# Patient Record
Sex: Female | Born: 2000
Health system: Southern US, Community
[De-identification: ages and names within clinical notes are randomized; demographics above are authoritative.]

---
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
## 2021-03-08 DIAGNOSIS — Z20822 Contact with and (suspected) exposure to covid-19: Secondary | ICD-10-CM | POA: Diagnosis not present

## 2021-03-11 ENCOUNTER — Other Ambulatory Visit (HOSPITAL_BASED_OUTPATIENT_CLINIC_OR_DEPARTMENT_OTHER): Payer: Self-pay

## 2021-03-11 MED ORDER — CARESTART COVID-19 HOME TEST VI KIT
PACK | 0 refills | Status: DC
  Filled 2021-03-11: qty 2, 4d supply, fill #0

## 2021-08-16 ENCOUNTER — Ambulatory Visit: Payer: 59 | Admitting: Family Medicine

## 2021-08-16 ENCOUNTER — Encounter: Payer: Self-pay | Admitting: Family Medicine

## 2021-08-16 VITALS — BP 117/53 | HR 78 | Temp 98.3°F | Resp 12 | Ht 67.0 in | Wt 184.2 lb

## 2021-08-16 DIAGNOSIS — Z0000 Encounter for general adult medical examination without abnormal findings: Secondary | ICD-10-CM

## 2021-08-16 DIAGNOSIS — N926 Irregular menstruation, unspecified: Secondary | ICD-10-CM

## 2021-08-16 DIAGNOSIS — Z7689 Persons encountering health services in other specified circumstances: Secondary | ICD-10-CM | POA: Diagnosis not present

## 2021-08-16 DIAGNOSIS — Z23 Encounter for immunization: Secondary | ICD-10-CM | POA: Diagnosis not present

## 2021-08-16 LAB — LIPID PANEL
Cholesterol: 181 mg/dL (ref 0–200)
HDL: 37.9 mg/dL — ABNORMAL LOW (ref >39.00)
LDL Cholesterol: 123 mg/dL — ABNORMAL HIGH (ref 0–99)
NonHDL: 142.82
Total CHOL/HDL Ratio: 5
Triglycerides: 98 mg/dL (ref 0.0–149.0)
VLDL: 19.6 mg/dL (ref 0.0–40.0)

## 2021-08-16 LAB — CBC WITH DIFFERENTIAL/PLATELET
Basophils Absolute: 0 10*3/uL (ref 0.0–0.1)
Basophils Relative: 0.3 % (ref 0.0–3.0)
Eosinophils Absolute: 0.1 10*3/uL (ref 0.0–0.7)
Eosinophils Relative: 1 % (ref 0.0–5.0)
HCT: 40.1 % (ref 36.0–49.0)
Hemoglobin: 13.1 g/dL (ref 12.0–16.0)
Lymphocytes Relative: 26.6 % (ref 24.0–48.0)
Lymphs Abs: 2.2 10*3/uL (ref 0.7–4.0)
MCHC: 32.6 g/dL (ref 31.0–37.0)
MCV: 84.5 fl (ref 78.0–98.0)
Monocytes Absolute: 0.6 10*3/uL (ref 0.1–1.0)
Monocytes Relative: 6.8 % (ref 3.0–12.0)
Neutro Abs: 5.4 10*3/uL (ref 1.4–7.7)
Neutrophils Relative %: 65.3 % (ref 43.0–71.0)
Platelets: 283 10*3/uL (ref 150.0–575.0)
RBC: 4.75 Mil/uL (ref 3.80–5.70)
RDW: 12.9 % (ref 11.4–15.5)
WBC: 8.3 10*3/uL (ref 4.5–13.5)

## 2021-08-16 LAB — COMPREHENSIVE METABOLIC PANEL
ALT: 11 U/L (ref 0–35)
AST: 16 U/L (ref 0–37)
Albumin: 4.6 g/dL (ref 3.5–5.2)
Alkaline Phosphatase: 80 U/L (ref 47–119)
BUN: 15 mg/dL (ref 6–23)
CO2: 28 mEq/L (ref 19–32)
Calcium: 9.7 mg/dL (ref 8.4–10.5)
Chloride: 102 mEq/L (ref 96–112)
Creatinine, Ser: 0.68 mg/dL (ref 0.40–1.20)
GFR: 125.97 mL/min (ref >60.00)
Glucose, Bld: 94 mg/dL (ref 70–99)
Potassium: 4.2 mEq/L (ref 3.5–5.1)
Sodium: 138 mEq/L (ref 135–145)
Total Bilirubin: 0.4 mg/dL (ref 0.2–1.2)
Total Protein: 8.1 g/dL (ref 6.0–8.3)

## 2021-08-16 LAB — TSH: TSH: 1.54 u[IU]/mL (ref 0.40–5.00)

## 2021-08-16 NOTE — Progress Notes (Signed)
BP (!) 117/53 (BP Location: Left Arm, Cuff Size: Normal)    Pulse 78    Temp 98.3 F (36.8 C) (Oral)    Resp 12    Ht 5\' 7"  (1.702 m)    Wt 184 lb 3.2 oz (83.6 kg)    LMP 06/02/2021    SpO2 100%    BMI 28.85 kg/m    Subjective:    Patient ID: Christina Coleman, female    DOB: [birth year], 21 y.o.   MRN: [id number]  HPI: Christina Coleman is a 21 y.o. female presenting on 08/16/2021 for comprehensive medical examination. Current medical complaints include: no, here to establish care  [employer] student - biology/premed   She currently lives with: parents and sisters Interim Problems from her last visit: no   She reports regular vision exams q1-5y: yes, prescription glasses She reports regular dental exams q 61m: yes Her diet consists of: [place] food, balance of protein, veggies, fruits She endorses exercise and/or activity of: workout 2-4x/week, walking, gym She works at: [employer]  She denies ETOH use. She denies nictoine use. She denies illegal substance use.    She reports irregular periods with heavy to moderate for > 1 week. Have always been irregular, usually one every 2-3 months, fluctuates. Current menopausal symptoms: no She is never  sexually active  She denies  concerns today about STI Contraception choices are: none  She denies concerns about skin changes today. She denies concerns about bowel changes today. She denies concerns about bladder changes today.   Depression Screen done today and results listed below:  Depression screen [facility] 2/9 08/16/2021  Decreased Interest 0  Down, Depressed, Hopeless 0  PHQ - 2 Score 0    She does not have a history of falls.      Past Medical History:  History reviewed. No pertinent past medical history.  Surgical History:  History reviewed. No pertinent surgical history.  Medications:  No current outpatient medications on file prior to visit.   No current facility-administered medications on file prior to visit.    Allergies:   No Known Allergies  Social History:  Social History   Socioeconomic History   Marital status: Single    Spouse name: Not on file   Number of children: Not on file   Years of education: Not on file   Highest education level: Not on file  Occupational History   Not on file  Tobacco Use   Smoking status: Never   Smokeless tobacco: Not on file  Substance and Sexual Activity   Alcohol use: Not on file   Drug use: Not on file   Sexual activity: Not on file  Other Topics Concern   Not on file  Social History Narrative   Not on file   Social Determinants of Health   Financial Resource Strain: Not on file  Food Insecurity: Not on file  Transportation Needs: Not on file  Physical Activity: Not on file  Stress: Not on file  Social Connections: Not on file  Intimate Partner Violence: Not on file   Social History   Tobacco Use  Smoking Status Never  Smokeless Tobacco Not on file   Social History   Substance and Sexual Activity  Alcohol Use None    Family History:  Family History  Problem Relation Age of Onset   Ulcerative colitis Father    Multiple sclerosis Sister    Diabetes Paternal Grandmother    Diabetes Paternal Grandfather     Past medical  history, surgical history, medications, allergies, family history and social history reviewed with patient today and changes made to appropriate areas of the chart.   All ROS negative except what is listed above and in the HPI.      Objective:    BP (!) 117/53 (BP Location: Left Arm, Cuff Size: Normal)    Pulse 78    Temp 98.3 F (36.8 C) (Oral)    Resp 12    Ht 5\' 7"  (1.702 m)    Wt 184 lb 3.2 oz (83.6 kg)    LMP 06/02/2021    SpO2 100%    BMI 28.85 kg/m   Wt Readings from Last 3 Encounters:  08/16/21 184 lb 3.2 oz (83.6 kg) (95 %, Z= 1.68)*  05/27/15 127 lb (57.6 kg) (81 %, Z= 0.88)*   * Growth percentiles are based on CDC (Girls, 2-20 Years) data.    Physical Exam Vitals reviewed.  Constitutional:       Appearance: Normal appearance.  HENT:     Head: Normocephalic and atraumatic.     Right Ear: Tympanic membrane normal.     Left Ear: Tympanic membrane normal.     Nose: Nose normal.     Mouth/Throat:     Mouth: Mucous membranes are moist.     Pharynx: Oropharynx is clear. No oropharyngeal exudate or posterior oropharyngeal erythema.  Eyes:     Extraocular Movements: Extraocular movements intact.     Conjunctiva/sclera: Conjunctivae normal.     Pupils: Pupils are equal, round, and reactive to light.  Cardiovascular:     Rate and Rhythm: Normal rate and regular rhythm.     Pulses: Normal pulses.     Heart sounds: Normal heart sounds.  Pulmonary:     Effort: Pulmonary effort is normal.     Breath sounds: Normal breath sounds.  Abdominal:     General: Abdomen is flat. Bowel sounds are normal. There is no distension.     Palpations: Abdomen is soft. There is no mass.     Tenderness: There is no abdominal tenderness. There is no right CVA tenderness, left CVA tenderness, guarding or rebound.     Hernia: No hernia is present.  Musculoskeletal:        General: Normal range of motion.     Cervical back: Normal range of motion and neck supple.  Skin:    General: Skin is warm and dry.     Capillary Refill: Capillary refill takes less than 2 seconds.  Neurological:     General: No focal deficit present.     Mental Status: She is alert and oriented to person, place, and time. Mental status is at baseline.  Psychiatric:        Mood and Affect: Mood normal.        Behavior: Behavior normal.        Thought Content: Thought content normal.        Judgment: Judgment normal.    No results found for this or any previous visit.    Assessment & Plan:   Problem List Items Addressed This Visit   None Visit Diagnoses     Annual physical exam    -  Primary   Relevant Orders   Comprehensive metabolic panel   CBC with Differential/Platelet   TSH   Lipid panel   Encounter to establish care        Irregular menstruation       Influenza vaccine administered  Relevant Orders   Flu Vaccine QUAD 36+ mos IM (Fluarix, Fluzone & Afluria Quad PF (Completed)     -Does not wish to further workup irregular periods at this time; states this is baseline for her, no acute changes. Educated that we would like her to have periods at least every 3 months. If she goes longer than this, then she should return for further workup. Patient is agreeable.     LABORATORY TESTING:  - Health maintenance labs ordered today as discussed above.           CBC, CMP, LIPIDS          TSH           - STI testing: deferred - Pap smear: deferred     IMMUNIZATIONS:   - Tdap: Tetanus vaccination status reviewed: last tetanus booster within 10 years. - Influenza: Administered today - Pneumovax: Not applicable - Prevnar: Not applicable - HPV: Up to date - Shingrix vaccine: Not applicable - [id number]: Up to date  SCREENING: - Mammogram: Not applicable - Bone Density: Not applicable - Colonoscopy: Not applicable  Discussed with patient purpose of the colonoscopy is to detect colon cancer at curable precancerous or early stages  - AAA Screening: Not applicable  -Hearing Test: Not applicable  -Spirometry: Not applicable  - Lung Cancer Screening: Not applicable    PATIENT COUNSELING:   Advised to take 1 mg of folate supplement per day if capable of pregnancy.   Sexuality: Discussed sexually transmitted diseases, partner selection, use of condoms, avoidance of unintended pregnancy, and contraceptive alternatives.    I discussed with the patient that most people either abstain from alcohol or drink within safe limits (<=14/week and <=4 drinks/occasion for males, <=7/weeks and <= 3 drinks/occasion for females) and that the risk for alcohol disorders and other health effects rises proportionally with the number of drinks per week and how often a drinker exceeds daily limits.  Discussed  cessation/primary prevention of drug use and availability of treatment for abuse.   Diet: Encouraged to adjust caloric intake to maintain or achieve ideal body weight, to reduce intake of dietary saturated fat and total fat, to limit sodium intake by avoiding high sodium foods and not adding table salt, and to maintain adequate dietary potassium and calcium preferably from fresh fruits, vegetables, and low-fat dairy products. Encouraged vitamin D 1000 units and Calcium 1300mg  or 4 servings of dairy a day.  Emphasized the importance of regular exercise.  Injury prevention: Discussed safety belts, safety helmets, smoke detector, smoking near bedding or upholstery.   Dental health: Discussed importance of regular tooth brushing, flossing, and dental visits.  Follow up plan:  Return if symptoms worsen or fail to improve, for follow-up if period doesn't start in the next month or so .   Purcell Nails Olevia Bowens, DNP, FNP-C

## 2021-08-16 NOTE — Patient Instructions (Addendum)
Thank you for choosing  Primary Care at [facility] for your Primary Care needs. I am excited for the opportunity to partner with you to meet your health care goals. It was a pleasure meeting you today! ° ° °Information on diet, exercise, and health maintenance recommendations are listed below. This is information to help you be sure you are on track for optimal health and monitoring.  ° °Please look over this and let us know if you have any questions or if you have completed any of the health maintenance outside of [place] so that we can be sure your records are up to date.  °___________________________________________________________ ° °MyChart:  °For all urgent or time sensitive needs we ask that you please call the office to avoid delays. Our number is [phone]. °MyChart is not constantly monitored and due to the large volume of messages a day, replies may take up to 72 business hours. ° °MyChart Policy: °MyChart allows for you to see your visit notes, after visit summary, provider recommendations, lab and tests results, make an appointment, request refills, and contact your provider or the office for non-urgent questions or concerns. Providers are seeing patients during normal business hours and do not have built in time to review MyChart messages.  °We ask that you allow a minimum of 3 business days for responses to MyChart messages. For this reason, please do not send urgent requests through MyChart. Please call the office at [phone]. °New and ongoing conditions may require a visit. We have virtual and in-person visits available for your convenience.  °Complex MyChart concerns may require a visit. Your provider may request you schedule a virtual or in-person visit to ensure we are providing the best care possible. °MyChart messages sent after 11:00 AM on Friday will not be received by the provider until Monday morning.  °  °Lab and Test Results: °You will receive your lab and  test results on MyChart as soon as they are completed and results have been sent by the lab or testing facility. Due to this service, you will receive your results BEFORE your provider.  °I review lab and test results each morning prior to seeing patients. Some results require collaboration with other providers to ensure you are receiving the most appropriate care. For this reason, we ask that you please allow a minimum of 3-5 business days from the time that ALL results have been received for your provider to receive and review lab and test results and contact you about these.  °Most lab and test result comments from the provider will be sent through MyChart. Your provider may recommend changes to the plan of care, follow-up visits, repeat testing, ask questions, or request an office visit to discuss these results. You may reply directly to this message or call the office to provide information for the provider or set up an appointment. °In some instances, you will be called with test results and recommendations. Please let us know if this is preferred and we will make note of this in your chart to provide this for you.    °If you have not heard a response to your lab or test results in 5 business days from all results returning to MyChart, please call the office to let us know. We ask that you please avoid calling prior to this time unless there is an emergent concern. Due to high call volumes, this can delay the resulting process. ° °After Hours: °For all non-emergency after hours needs,   please call the office at [phone] and select the option to reach the on-call  service. On-call services are shared between multiple [place] offices and therefore it will not be possible to speak directly with your provider. On-call providers may provide medical advice and recommendations, but are unable to provide refills for maintenance medications.  °For all emergency or urgent medical needs after normal business  hours, we recommend that you seek care at the closest Urgent Care or Emergency Department to ensure appropriate treatment in a timely manner.  °[facility] has a 24 hour emergency room located on the ground floor for your convenience.  ° °Urgent Concerns During the Business Day °Providers are seeing patients from 8AM to 5PM with a busy schedule and are most often not able to respond to non-urgent calls until the end of the day or the next business day. °If you should have URGENT concerns during the day, please call and speak to the nurse or schedule a same day appointment so that we can address your concern without delay.  ° °Thank you, again, for choosing me as your health care partner. I appreciate your trust and look forward to learning more about you.  ° °Waniya Hoglund B. Laquan Ludden, DNP, FNP-C ° °___________________________________________________________ ° °Health Maintenance Recommendations °Screening Testing °Mammogram °Every 1-2 years based on history and risk factors °Starting at age 50 °Pap Smear °Ages 21-39 every 3 years °Ages 30-65 every 5 years with HPV testing °More frequent testing may be required based on results and history °Colon Cancer Screening °Every 1-10 years based on test performed, risk factors, and history °Starting at age 45 °Bone Density Screening °Every 2-10 years based on history °Starting at age 65 for women °Recommendations for men differ based on medication usage, history, and risk factors °AAA Screening °One time ultrasound °Men 65-75 years old who have ever smoked °Lung Cancer Screening °Low Dose Lung CT every 12 months °Age 50-80 years with a 20 pack-year smoking history who still smoke or who have quit within the last 15 years ° °Screening Labs °Routine  Labs: Complete Blood Count (CBC), Complete Metabolic Panel (CMP), Cholesterol (Lipid Panel) °Every 6-12 months based on history and medications °May be recommended more frequently based on current conditions or  previous results °Hemoglobin A1c Lab °Every 3-12 months based on history and previous results °Starting at age 45 or earlier with diagnosis of diabetes, high cholesterol, BMI >26, and/or risk factors °Frequent monitoring for patients with diabetes to ensure blood sugar control °Thyroid Panel (TSH w/ T3 & T4) °Every 6 months based on history, symptoms, and risk factors °May be repeated more often if on medication °HIV °One time testing for all patients 13 and older °May be repeated more frequently for patients with increased risk factors or exposure °Hepatitis C °One time testing for all patients 18 and older °May be repeated more frequently for patients with increased risk factors or exposure °Gonorrhea, Chlamydia °Every 12 months for all sexually active persons 13-24 years °Additional monitoring may be recommended for those who are considered high risk or who have symptoms °PSA °Men 40-54 years old with risk factors °Additional screening may be recommended from age 55-69 based on risk factors, symptoms, and history ° °Vaccine Recommendations °Tetanus Booster °All adults every 10 years °Flu Vaccine °All patients 6 months and older every year °COVID Vaccine °All patients 12 years and older °Initial dosing with booster °May recommend additional booster based on age and health history °HPV Vaccine °2 doses all patients   age 9-26 °Dosing may be considered for patients over 26 °Shingles Vaccine (Shingrix) °2 doses all adults 50 years and older °Pneumonia (Pneumovax 23) °All adults 65 years and older °May recommend earlier dosing based on health history °Pneumonia (Prevnar 13) °All adults 65 years and older °Dosed 1 year after Pneumovax 23 °Pneumonia (Prevnar 20) °All adults 65 years and older (adults 19-64 with certain conditions or risk factors) °1 dose  °For those who have no received Prevnar 13 vaccine previously ° ° °Additional Screening, Testing, and Vaccinations may be recommended on an individualized basis based on  family history, health history, risk factors, and/or exposure.  °__________________________________________________________ ° °Diet Recommendations for All Patients ° °I recommend that all patients maintain a diet low in saturated fats, carbohydrates, and cholesterol. While this can be challenging at first, it is not impossible and small changes can make big differences.  °Things to try: °Decreasing the amount of soda, sweet tea, and/or juice to one or less per day and replace with water °While water is always the first choice, if you do not like water you may consider °adding a water additive without sugar to improve the taste °other sugar free drinks °Replace potatoes with a brightly colored vegetable at dinner °Use healthy oils, such as canola oil or olive oil, instead of butter or hard margarine °Limit your bread intake to two pieces or less a day °Replace regular pasta with low carb pasta options °Bake, broil, or grill foods instead of frying °Monitor portion sizes  °Eat smaller, more frequent meals throughout the day instead of large meals ° °An important thing to remember is, if you love foods that are not great for your health, you don't have to give them up completely. Instead, allow these foods to be a reward when you have done well. Allowing yourself to still have special treats every once in a while is a nice way to tell yourself thank you for working hard to keep yourself healthy.  ° °Also remember that every day is a new day. If you have a bad day and "fall off the wagon", you can still climb right back up and keep moving along on your journey! ° °We have resources available to help you!  °Some websites that may be helpful include: °www.MyPlate.gov  °Www.VeryWellFit.com °_____________________________________________________________ ° °Activity Recommendations for All Patients ° °I recommend that all adults get at least 20 minutes of moderate physical activity that elevates your heart rate at least 5  days out of the week.  °Some examples include: °Walking or jogging at a pace that allows you to carry on a conversation °Cycling (stationary bike or outdoors) °Water aerobics °Yoga °Weight lifting °Dancing °If physical limitations prevent you from putting stress on your joints, exercise in a pool or seated in a chair are excellent options. ° °Do determine your MAXIMUM heart rate for activity: YOUR AGE - 220 = MAX HeartRate  ° °Remember! °Do not push yourself too hard.  °Start slowly and build up your pace, speed, weight, time in exercise, etc.  °Allow your body to rest between exercise and get good sleep. °You will need more water than normal when you are exerting yourself. Do not wait until you are thirsty to drink. Drink with a purpose of getting in at least 8, 8 ounce glasses of water a day plus more depending on how much you exercise and sweat.  ° ° °If you begin to develop dizziness, chest pain, abdominal pain, jaw pain, shortness of breath, headache, vision   changes, lightheadedness, or other concerning symptoms, stop the activity and allow your body to rest. If your symptoms are severe, seek emergency evaluation immediately. If your symptoms are concerning, but not severe, please let us know so that we can recommend further evaluation.  ° ° ° °

## 2021-09-01 DIAGNOSIS — H04123 Dry eye syndrome of bilateral lacrimal glands: Secondary | ICD-10-CM | POA: Diagnosis not present

## 2021-09-01 DIAGNOSIS — H52223 Regular astigmatism, bilateral: Secondary | ICD-10-CM | POA: Diagnosis not present

## 2021-11-24 DIAGNOSIS — M25571 Pain in right ankle and joints of right foot: Secondary | ICD-10-CM | POA: Diagnosis not present

## 2022-04-29 ENCOUNTER — Emergency Department (HOSPITAL_BASED_OUTPATIENT_CLINIC_OR_DEPARTMENT_OTHER)
Admission: EM | Admit: 2022-04-29 | Discharge: 2022-04-29 | Disposition: A | Discharge status: Home / Self Care | Payer: 59 | Attending: Emergency Medicine | Admitting: Emergency Medicine

## 2022-04-29 ENCOUNTER — Emergency Department (HOSPITAL_BASED_OUTPATIENT_CLINIC_OR_DEPARTMENT_OTHER): Payer: 59 | Admitting: Radiology

## 2022-04-29 ENCOUNTER — Other Ambulatory Visit: Payer: Self-pay

## 2022-04-29 ENCOUNTER — Encounter (HOSPITAL_BASED_OUTPATIENT_CLINIC_OR_DEPARTMENT_OTHER): Payer: Self-pay

## 2022-04-29 DIAGNOSIS — B9789 Other viral agents as the cause of diseases classified elsewhere: Secondary | ICD-10-CM | POA: Diagnosis not present

## 2022-04-29 DIAGNOSIS — Z20822 Contact with and (suspected) exposure to covid-19: Secondary | ICD-10-CM | POA: Diagnosis not present

## 2022-04-29 DIAGNOSIS — R059 Cough, unspecified: Secondary | ICD-10-CM | POA: Diagnosis not present

## 2022-04-29 DIAGNOSIS — J069 Acute upper respiratory infection, unspecified: Secondary | ICD-10-CM | POA: Insufficient documentation

## 2022-04-29 DIAGNOSIS — J028 Acute pharyngitis due to other specified organisms: Secondary | ICD-10-CM | POA: Diagnosis not present

## 2022-04-29 DIAGNOSIS — J029 Acute pharyngitis, unspecified: Secondary | ICD-10-CM

## 2022-04-29 DIAGNOSIS — I10 Essential (primary) hypertension: Secondary | ICD-10-CM | POA: Diagnosis not present

## 2022-04-29 LAB — URINALYSIS, ROUTINE W REFLEX MICROSCOPIC
Bilirubin Urine: NEGATIVE
Glucose, UA: NEGATIVE mg/dL
Hgb urine dipstick: NEGATIVE
Ketones, ur: NEGATIVE mg/dL
Leukocytes,Ua: NEGATIVE
Nitrite: NEGATIVE
Protein, ur: NEGATIVE mg/dL
Specific Gravity, Urine: <1.005 — ABNORMAL LOW (ref 1.005–1.030)
pH: 6.5 (ref 5.0–8.0)

## 2022-04-29 LAB — RESP PANEL BY RT-PCR (FLU A&B, COVID) ARPGX2
Influenza A by PCR: NEGATIVE
Influenza B by PCR: NEGATIVE
SARS Coronavirus 2 by RT PCR: NEGATIVE

## 2022-04-29 LAB — GROUP A STREP BY PCR: Group A Strep by PCR: NOT DETECTED

## 2022-04-29 LAB — PREGNANCY, URINE: Preg Test, Ur: NEGATIVE

## 2022-04-29 LAB — CBG MONITORING, ED: Glucose-Capillary: 106 mg/dL — ABNORMAL HIGH (ref 70–99)

## 2022-04-29 MED ORDER — SODIUM CHLORIDE 0.9 % IV BOLUS: 1000.0000 mL | Freq: Once | INTRAVENOUS | Status: DC

## 2022-04-29 MED ORDER — LIDOCAINE VISCOUS HCL 2 % MT SOLN
15.0000 mL | Freq: Once | OROMUCOSAL | Status: AC
  Administered 2022-04-29: 15 mL via OROMUCOSAL
  Filled 2022-04-29: qty 15

## 2022-04-29 MED ORDER — GUAIFENESIN 100 MG/5ML PO LIQD: 5.0000 mL | ORAL | 0 refills | Status: DC | PRN

## 2022-04-29 MED ORDER — PSEUDOEPHEDRINE HCL ER 120 MG PO TB12: 120.0000 mg | ORAL_TABLET | Freq: Two times a day (BID) | ORAL | 0 refills | Status: DC

## 2022-04-29 MED ORDER — MENTHOL 3 MG MT LOZG: 1.0000 | LOZENGE | OROMUCOSAL | 0 refills | Status: DC | PRN

## 2022-04-29 MED ORDER — IBUPROFEN 600 MG PO TABS: 600.0000 mg | ORAL_TABLET | Freq: Four times a day (QID) | ORAL | 0 refills | Status: DC | PRN

## 2022-04-29 MED ORDER — ACETAMINOPHEN 325 MG PO TABS: 650.0000 mg | ORAL_TABLET | Freq: Four times a day (QID) | ORAL | 0 refills | Status: DC | PRN

## 2022-04-29 MED ORDER — FLUTICASONE PROPIONATE 50 MCG/ACT NA SUSP: 1.0000 | Freq: Every day | NASAL | 0 refills | Status: DC

## 2022-04-29 MED ORDER — PSEUDOEPHEDRINE HCL ER 120 MG PO TB12: 120.0000 mg | ORAL_TABLET | Freq: Two times a day (BID) | ORAL | 0 refills | Status: AC

## 2022-04-29 NOTE — ED Provider Notes (Addendum)
[facility] EMERGENCY DEPT Provider Note   CSN: [id number] Arrival date & time: 04/29/22  1646     History  Chief Complaint  Patient presents with   Sore Throat    Christina Coleman is a 21 y.o. female.  Patient as above with significant medical history as below, including no significant medical history who presents to the ED with complaint of throat, cough, congestion, postnasal drip, body aches.  No fevers or chills.  Symptom onset 2 days ago.  No nausea or vomiting.  Tried OTC analgesics without significant relief of her symptoms.  No recent travel or sick contacts.  She has mild difficulty swallowing secondary to discomfort in her throat.  No voice changes, no drooling.  Is able to tolerate secretions without much difficulty.  No pain with neck movement.  No chest pain.  Acting at baseline per family at bedside.  No change to bowel or bladder function.     History reviewed. No pertinent past medical history.  History reviewed. No pertinent surgical history.   The history is provided by the patient. No language interpreter was used.  Sore Throat Pertinent negatives include no chest pain, no abdominal pain, no headaches and no shortness of breath.       Home Medications Prior to Admission medications   Medication Sig Start Date End Date Taking? Authorizing Provider  acetaminophen (TYLENOL) 325 MG tablet Take 2 tablets (650 mg total) by mouth every 6 (six) hours as needed. 04/29/22   Sloan Leiter, DO  fluticasone (FLONASE) 50 MCG/ACT nasal spray Place 1 spray into both nostrils daily for 7 days. 04/29/22 05/06/22  Sloan Leiter, DO  guaiFENesin (ROBITUSSIN) 100 MG/5ML liquid Take 5 mLs by mouth every 4 (four) hours as needed for cough or to loosen phlegm. 04/29/22   Sloan Leiter, DO  ibuprofen (ADVIL) 600 MG tablet Take 1 tablet (600 mg total) by mouth every 6 (six) hours as needed. 04/29/22   Sloan Leiter, DO  menthol-cetylpyridinium (CEPACOL) 3 MG lozenge Take 1  lozenge (3 mg total) by mouth as needed for sore throat. 04/29/22   Sloan Leiter, DO  pseudoephedrine (SUDAFED 12 HOUR) 120 MG 12 hr tablet Take 1 tablet (120 mg total) by mouth 2 (two) times daily for 5 days. 04/29/22 05/04/22  Sloan Leiter, DO      Allergies    Patient has no known allergies.    Review of Systems   Review of Systems  Constitutional:  Negative for activity change and fever.  HENT:  Positive for congestion, postnasal drip, rhinorrhea and sore throat. Negative for facial swelling and trouble swallowing.   Eyes:  Negative for discharge and redness.  Respiratory:  Positive for cough. Negative for shortness of breath.   Cardiovascular:  Negative for chest pain and palpitations.  Gastrointestinal:  Negative for abdominal pain and nausea.  Genitourinary:  Negative for dysuria and flank pain.  Musculoskeletal:  Negative for back pain and gait problem.  Skin:  Negative for pallor and rash.  Neurological:  Negative for syncope and headaches.    Physical Exam Updated Vital Signs BP 123/85   Pulse 78   Temp 98.4 F (36.9 C) (Oral)   Resp 17   Ht 5\' 7"  (1.702 m)   Wt 79.4 kg   SpO2 100%   BMI 27.41 kg/m  Physical Exam Vitals and nursing note reviewed.  Constitutional:      General: She is not in acute distress.    Appearance:  Normal appearance. She is well-developed. She is not ill-appearing or diaphoretic.  HENT:     Head: Normocephalic and atraumatic.     Right Ear: Tympanic membrane and external ear normal.     Left Ear: Tympanic membrane and external ear normal.     Nose: Nose normal.     Mouth/Throat:     Mouth: Mucous membranes are moist.     Pharynx: Uvula midline. No oropharyngeal exudate or uvula swelling.     Comments: Cobblestoning noted to posterior oropharynx No angioedema, no obvious abscess No drooling trismus or stridor Eyes:     General: No scleral icterus.       Right eye: No discharge.        Left eye: No discharge.     Extraocular  Movements: Extraocular movements intact.     Pupils: Pupils are equal, round, and reactive to light.  Cardiovascular:     Rate and Rhythm: Normal rate and regular rhythm.     Pulses: Normal pulses.     Heart sounds: Normal heart sounds.  Pulmonary:     Effort: Pulmonary effort is normal. No respiratory distress.     Breath sounds: Normal breath sounds.  Abdominal:     General: Abdomen is flat.     Tenderness: There is no abdominal tenderness.  Musculoskeletal:        General: Normal range of motion.     Cervical back: Full passive range of motion without pain and normal range of motion.     Right lower leg: No edema.     Left lower leg: No edema.  Lymphadenopathy:     Comments: Anterior cervical lymphadenopathy bilateral  Skin:    General: Skin is warm and dry.     Capillary Refill: Capillary refill takes less than 2 seconds.  Neurological:     Mental Status: She is alert and oriented to person, place, and time.     GCS: GCS eye subscore is 4. GCS verbal subscore is 5. GCS motor subscore is 6.     Cranial Nerves: Cranial nerves 2-12 are intact.     Sensory: Sensation is intact.     Motor: Motor function is intact.     Coordination: Coordination is intact.     Gait: Gait is intact.  Psychiatric:        Mood and Affect: Mood normal.        Behavior: Behavior normal.     ED Results / Procedures / Treatments   Labs (all labs ordered are listed, but only abnormal results are displayed) Labs Reviewed  URINALYSIS, ROUTINE W REFLEX MICROSCOPIC - Abnormal; Notable for the following components:      Result Value   Color, Urine COLORLESS (*)    Specific Gravity, Urine <1.005 (*)    All other components within normal limits  CBG MONITORING, ED - Abnormal; Notable for the following components:   Glucose-Capillary 106 (*)    All other components within normal limits  RESP PANEL BY RT-PCR (FLU A&B, COVID) [id number]  GROUP A STREP BY PCR  PREGNANCY, URINE    EKG EKG  Interpretation  Date/Time:  Friday April 29 2022 19:52:17 EDT Ventricular Rate:  87 PR Interval:  206 QRS Duration: 82 QT Interval:  332 QTC Calculation: 400 R Axis:   38 Text Interpretation: Sinus rhythm Borderline prolonged PR interval Low voltage, precordial leads No old tracing to compare Confirmed by Tanda Rockers ([id number]) on 04/29/2022 9:52:59 PM  Radiology DG Chest 2 View  Result Date: 04/29/2022 CLINICAL DATA:  Cough EXAM: CHEST - 2 VIEW COMPARISON:  None Available. FINDINGS: The heart size and mediastinal contours are within normal limits. Both lungs are clear. The visualized skeletal structures are unremarkable. IMPRESSION: No active cardiopulmonary disease. Electronically Signed   By: Ronney Asters M.D.   On: 04/29/2022 18:41    Procedures Procedures    Medications Ordered in ED Medications  lidocaine (XYLOCAINE) 2 % viscous mouth solution 15 mL (15 mLs Mouth/Throat Given 04/29/22 1907)    ED Course/ Medical Decision Making/ A&P                           Medical Decision Making Amount and/or Complexity of Data Reviewed Labs: ordered. Radiology: ordered.  Risk OTC drugs. Prescription drug management.   This patient presents to the ED with chief complaint(s) of URI symptoms, sore throat with pertinent past medical history of of which further complicates the presenting complaint. The complaint involves an extensive differential diagnosis and also carries with it a high risk of complications and morbidity.    The differential diagnosis includes but not limited to pharyngitis, strep, viral syndrome, COVID-19, influenza, sinusitis, bacterial infection, pneumonia, etc. Serious etiologies were considered.   The initial plan is to RVP, CBG, strep, chest x-ray, urinalysis, POC glucose   Additional history obtained: Additional history obtained from family Records reviewed Primary Care Documents  Independent labs interpretation:  The following labs were independently  interpreted: RVP, strep were negative.  Preg negative.  POC glucose normal.  Urinalysis without acute abnormalities.  No infection.  Independent visualization of imaging: - I independently visualized the following imaging with scope of interpretation limited to determining acute life threatening conditions related to emergency care: Chest x-ray, which revealed no acute changes  Cardiac monitoring was reviewed and interpreted by myself which shows NSR, EKG unremarkable  Treatment and Reassessment: Viscous lidocaine >> Symptoms greatly improved  Consultation: - Consulted or discussed management/test interpretation w/ external professional:   Consideration for admission or further workup: Admission was considered    Patient with likely viral URI with cough, sore throat, viral syndrome.  Congestion, postnasal drip.  Discussed supportive care at home.  Rehydration, rest.  Rx sent to pharmacy.  Strict return precautions were discussed.  Low suspicion for bacterial infection.  Heart rate initially was elevated but has since improved.  Afebrile.  HDS.  No chest pain or palpitations.  No dyspnea.  The patient improved significantly and was discharged in stable condition. Detailed discussions were had with the patient regarding current findings, and need for close f/u with PCP or on call doctor. The patient has been instructed to return immediately if the symptoms worsen in any way for re-evaluation. Patient verbalized understanding and is in agreement with current care plan. All questions answered prior to discharge.    Social Determinants of health: Social History   Tobacco Use   Smoking status: Never  Substance Use Topics   Alcohol use: Not Currently   Drug use: Not Currently            Final Clinical Impression(s) / ED Diagnoses Final diagnoses:  Viral pharyngitis  URI with cough and congestion    Rx / DC Orders ED Discharge Orders          Ordered    fluticasone  (FLONASE) 50 MCG/ACT nasal spray  Daily,   Status:  Discontinued        04/29/22 2154    acetaminophen (  TYLENOL) 325 MG tablet  Every 6 hours PRN,   Status:  Discontinued        04/29/22 2154    ibuprofen (ADVIL) 600 MG tablet  Every 6 hours PRN,   Status:  Discontinued        04/29/22 2154    pseudoephedrine (SUDAFED 12 HOUR) 120 MG 12 hr tablet  2 times daily,   Status:  Discontinued        04/29/22 2154    guaiFENesin (ROBITUSSIN) 100 MG/5ML liquid  Every 4 hours PRN,   Status:  Discontinued        04/29/22 2154    menthol-cetylpyridinium (CEPACOL) 3 MG lozenge  As needed,   Status:  Discontinued        04/29/22 2154    acetaminophen (TYLENOL) 325 MG tablet  Every 6 hours PRN        04/29/22 2235    fluticasone (FLONASE) 50 MCG/ACT nasal spray  Daily        04/29/22 2235    guaiFENesin (ROBITUSSIN) 100 MG/5ML liquid  Every 4 hours PRN        04/29/22 2235    ibuprofen (ADVIL) 600 MG tablet  Every 6 hours PRN        04/29/22 2235    menthol-cetylpyridinium (CEPACOL) 3 MG lozenge  As needed        04/29/22 2235    pseudoephedrine (SUDAFED 12 HOUR) 120 MG 12 hr tablet  2 times daily        04/29/22 2235              Sloan Leiter, DO 04/30/22 2259    Sloan Leiter, DO 04/30/22 2300

## 2022-04-29 NOTE — ED Triage Notes (Signed)
Patient here POV from Home.  Endorses Subjective Fevers, Chills, Dry Cough, Back Pain, Bilateral Otalgia that began today and Initially began with Sore Throat yesterday.  NAD Noted during Triage. A&Ox4. GCS 15. Ambulatory.

## 2022-04-29 NOTE — Discharge Instructions (Addendum)
It was a pleasure caring for you today in the emergency department. ° °Please return to the emergency department for any worsening or worrisome symptoms. ° ° °

## 2022-04-29 NOTE — ED Notes (Signed)
Pt verbalizes understanding of discharge instructions. Opportunity for questioning and answers were provided. Pt discharged from ED to home with family.    

## 2022-08-16 ENCOUNTER — Encounter: Payer: Self-pay | Admitting: Family Medicine

## 2022-08-16 ENCOUNTER — Ambulatory Visit (INDEPENDENT_AMBULATORY_CARE_PROVIDER_SITE_OTHER): Payer: Commercial Managed Care - PPO | Admitting: Family Medicine

## 2022-08-16 VITALS — BP 107/59 | HR 76 | Temp 98.1°F | Resp 16 | Ht 67.0 in | Wt 182.2 lb

## 2022-08-16 DIAGNOSIS — Z0000 Encounter for general adult medical examination without abnormal findings: Secondary | ICD-10-CM

## 2022-08-16 LAB — COMPREHENSIVE METABOLIC PANEL
ALT: 13 U/L (ref 0–35)
AST: 16 U/L (ref 0–37)
Albumin: 4.7 g/dL (ref 3.5–5.2)
Alkaline Phosphatase: 79 U/L (ref 39–117)
BUN: 12 mg/dL (ref 6–23)
CO2: 31 mEq/L (ref 19–32)
Calcium: 10.2 mg/dL (ref 8.4–10.5)
Chloride: 101 mEq/L (ref 96–112)
Creatinine, Ser: 0.69 mg/dL (ref 0.40–1.20)
GFR: 124.65 mL/min (ref >60.00)
Glucose, Bld: 82 mg/dL (ref 70–99)
Potassium: 4.7 mEq/L (ref 3.5–5.1)
Sodium: 138 mEq/L (ref 135–145)
Total Bilirubin: 0.4 mg/dL (ref 0.2–1.2)
Total Protein: 8.5 g/dL — ABNORMAL HIGH (ref 6.0–8.3)

## 2022-08-16 LAB — LIPID PANEL
Cholesterol: 212 mg/dL — ABNORMAL HIGH (ref 0–200)
HDL: 36.6 mg/dL — ABNORMAL LOW (ref >39.00)
LDL Cholesterol: 157 mg/dL — ABNORMAL HIGH (ref 0–99)
NonHDL: 175.1
Total CHOL/HDL Ratio: 6
Triglycerides: 90 mg/dL (ref 0.0–149.0)
VLDL: 18 mg/dL (ref 0.0–40.0)

## 2022-08-16 LAB — CBC WITH DIFFERENTIAL/PLATELET
Basophils Absolute: 0 10*3/uL (ref 0.0–0.1)
Basophils Relative: 0.3 % (ref 0.0–3.0)
Eosinophils Absolute: 0.1 10*3/uL (ref 0.0–0.7)
Eosinophils Relative: 1.3 % (ref 0.0–5.0)
HCT: 37.7 % (ref 36.0–46.0)
Hemoglobin: 12.8 g/dL (ref 12.0–15.0)
Lymphocytes Relative: 35.7 % (ref 12.0–46.0)
Lymphs Abs: 2.3 10*3/uL (ref 0.7–4.0)
MCHC: 33.9 g/dL (ref 30.0–36.0)
MCV: 82.9 fl (ref 78.0–100.0)
Monocytes Absolute: 0.6 10*3/uL (ref 0.1–1.0)
Monocytes Relative: 8.5 % (ref 3.0–12.0)
Neutro Abs: 3.5 10*3/uL (ref 1.4–7.7)
Neutrophils Relative %: 54.2 % (ref 43.0–77.0)
Platelets: 358 10*3/uL (ref 150.0–400.0)
RBC: 4.55 Mil/uL (ref 3.87–5.11)
RDW: 13 % (ref 11.5–14.6)
WBC: 6.5 10*3/uL (ref 4.5–10.5)

## 2022-08-16 LAB — TSH: TSH: 2.45 u[IU]/mL (ref 0.35–5.50)

## 2022-08-16 NOTE — Progress Notes (Signed)
Complete physical exam  Patient: Christina Coleman   DOB: [birth year]   22 y.o. Female  MRN: [id number]  Subjective:    Chief Complaint  Patient presents with   Annual Exam    Christina Coleman is a 22 y.o. female who presents today for a complete physical exam. She reports consuming a general diet. Gym/ health club routine includes exercise classes (pilates, cycling). She generally feels well. She reports sleeping well. She does not have additional problems to discuss today.    Most recent fall risk assessment:    08/16/2021    8:42 AM  Fall Risk   Falls in the past year? 0     Most recent depression screenings:    08/16/2022    9:44 AM 08/16/2021    8:42 AM  PHQ 2/9 Scores  PHQ - 2 Score 0 0    Vision:Within last year, Dental: No current dental problems and Receives regular dental care, and STD: not sexually active LMP 07/22/22   There are no problems to display for this patient.  History reviewed. No pertinent past medical history. Family History  Problem Relation Age of Onset   Ulcerative colitis Father    Multiple sclerosis Sister    Diabetes Paternal Grandmother    Diabetes Paternal Grandfather    No Known Allergies    Patient Care Team: Terrilyn Saver, NP as PCP - General (Family Medicine)   Outpatient Medications Prior to Visit  Medication Sig   ibuprofen (ADVIL) 600 MG tablet Take 1 tablet (600 mg total) by mouth every 6 (six) hours as needed.   menthol-cetylpyridinium (CEPACOL) 3 MG lozenge Take 1 lozenge (3 mg total) by mouth as needed for sore throat.   [DISCONTINUED] acetaminophen (TYLENOL) 325 MG tablet Take 2 tablets (650 mg total) by mouth every 6 (six) hours as needed.   [DISCONTINUED] fluticasone (FLONASE) 50 MCG/ACT nasal spray Place 1 spray into both nostrils daily for 7 days.   [DISCONTINUED] guaiFENesin (ROBITUSSIN) 100 MG/5ML liquid Take 5 mLs by mouth every 4 (four) hours as needed for cough or to loosen phlegm.   No facility-administered medications  prior to visit.    ROS All review of systems negative except what is listed in the HPI        Objective:     BP (!) 107/59   Pulse 76   Temp 98.1 F (36.7 C)   Resp 16   Ht 5\' 7"  (1.702 m)   Wt 182 lb 3.2 oz (82.6 kg)   SpO2 97%   BMI 28.54 kg/m    Physical Exam Vitals reviewed.  Constitutional:      General: She is not in acute distress.    Appearance: Normal appearance. She is not ill-appearing.  HENT:     Head: Normocephalic and atraumatic.     Right Ear: Tympanic membrane normal.     Left Ear: Tympanic membrane normal.     Nose: Nose normal.     Mouth/Throat:     Mouth: Mucous membranes are moist.     Pharynx: Oropharynx is clear.  Eyes:     Extraocular Movements: Extraocular movements intact.     Conjunctiva/sclera: Conjunctivae normal.     Pupils: Pupils are equal, round, and reactive to light.  Neck:     Vascular: No carotid bruit.  Cardiovascular:     Rate and Rhythm: Normal rate and regular rhythm.     Pulses: Normal pulses.     Heart sounds: Normal heart sounds.  Pulmonary:  Effort: Pulmonary effort is normal.     Breath sounds: Normal breath sounds.  Abdominal:     General: Abdomen is flat. Bowel sounds are normal. There is no distension.     Palpations: Abdomen is soft. There is no mass.     Tenderness: There is no abdominal tenderness. There is no right CVA tenderness, left CVA tenderness, guarding or rebound.  Genitourinary:    Comments: Deferred exam Musculoskeletal:        General: Normal range of motion.     Cervical back: Normal range of motion and neck supple. No tenderness.     Right lower leg: No edema.     Left lower leg: No edema.  Lymphadenopathy:     Cervical: No cervical adenopathy.  Skin:    General: Skin is warm and dry.     Capillary Refill: Capillary refill takes less than 2 seconds.  Neurological:     General: No focal deficit present.     Mental Status: She is alert and oriented to person, place, and time. Mental  status is at baseline.  Psychiatric:        Mood and Affect: Mood normal.        Behavior: Behavior normal.        Thought Content: Thought content normal.        Judgment: Judgment normal.          No results found for any visits on 08/16/22.     Assessment & Plan:    Routine Health Maintenance and Physical Exam  Immunization History  Administered Date(s) Administered   COVID-19, mRNA, vaccine(Comirnaty)12 years and older 05/15/2022   DTaP 01/16/2002, 03/21/2002, 05/18/2002, 05/19/2003, 01/23/2007   HPV 9-valent 03/26/2015, 05/25/2015, 05/30/2019   Hepatitis B, PED/ADOLESCENT 09-27-2001, 12/18/2001, 06/20/2002   IPV 01/16/2002, 03/21/2002, 05/18/2002, 01/23/2007   Influenza,inj,Quad PF,6+ Mos 08/16/2021   Influenza-Unspecified 05/15/2022   MMR 03/10/2003, 01/23/2007   Meningococcal B, OMV 05/30/2019   Meningococcal Conjugate 03/06/2014, 05/30/2019   PFIZER(Purple Top)SARS-COV-2 Vaccination 10/24/2019, 11/18/2019, 06/30/2020   Pneumococcal-Unspecified 12/18/2001, 01/30/2002, 06/20/2002   Tdap 10/09/2012   Varicella 11/18/2002, 01/23/2007    Health Maintenance  Topic Date Due   DTaP/Tdap/Td (7 - Td or Tdap) 10/10/2022   INFLUENZA VACCINE  Completed   HPV VACCINES  Completed   COVID-19 Vaccine  Completed   Hepatitis C Screening  Discontinued   HIV Screening  Discontinued    Discussed health benefits of physical activity, and encouraged her to engage in regular exercise appropriate for her age and condition.  Problem List Items Addressed This Visit   None Visit Diagnoses     Annual physical exam    -  Primary   Relevant Orders   CBC with Differential/Platelet   Comprehensive metabolic panel   Lipid panel   TSH      Return in about 1 year (around 08/17/2023) for physical.     Terrilyn Saver, NP

## 2022-08-22 ENCOUNTER — Telehealth: Payer: Self-pay | Admitting: Family Medicine

## 2022-08-22 NOTE — Telephone Encounter (Signed)
Pt called wanting to get a TB Test done and scheduled via MyChart. Per conversation with Inda Castle, pt needs to have an order put in and scheduled for NV not OV. Pt was scheduled for 1.31.24 @ 2:30p. Please advise.

## 2022-08-23 NOTE — Telephone Encounter (Signed)
done

## 2022-08-24 ENCOUNTER — Ambulatory Visit: Payer: Commercial Managed Care - PPO | Admitting: Family Medicine

## 2022-08-26 ENCOUNTER — Ambulatory Visit: Payer: Commercial Managed Care - PPO

## 2022-08-30 ENCOUNTER — Ambulatory Visit (INDEPENDENT_AMBULATORY_CARE_PROVIDER_SITE_OTHER): Payer: Commercial Managed Care - PPO

## 2022-08-30 DIAGNOSIS — Z111 Encounter for screening for respiratory tuberculosis: Secondary | ICD-10-CM | POA: Diagnosis not present

## 2022-08-30 NOTE — Progress Notes (Signed)
Patient here today for a transdermal TB Skin test per Caleen Jobs . Injection was given in left forearm , wheel present at time of injection , patient tolerated injection well.

## 2022-09-01 ENCOUNTER — Ambulatory Visit: Payer: Commercial Managed Care - PPO

## 2022-09-01 LAB — TB SKIN TEST
Induration: NEGATIVE mm
TB Skin Test: NEGATIVE

## 2022-09-01 NOTE — Progress Notes (Addendum)
Patient was here for Tb skin test read.  Site looks good and test negative.

## 2022-09-05 ENCOUNTER — Encounter: Payer: Self-pay | Admitting: Family Medicine

## 2022-09-05 ENCOUNTER — Ambulatory Visit: Payer: Commercial Managed Care - PPO | Admitting: Family Medicine

## 2022-09-05 VITALS — BP 96/79 | HR 78 | Temp 98.1°F | Resp 16 | Ht 67.0 in | Wt 182.6 lb

## 2022-09-05 DIAGNOSIS — H6690 Otitis media, unspecified, unspecified ear: Secondary | ICD-10-CM

## 2022-09-05 MED ORDER — AMOXICILLIN 875 MG PO TABS: 875.0000 mg | ORAL_TABLET | Freq: Two times a day (BID) | ORAL | 0 refills | Status: AC

## 2022-09-05 NOTE — Progress Notes (Signed)
   Acute Office Visit  Subjective:     Patient ID: Christina Coleman, female    DOB: 2001/06/27, 22 y.o.   MRN: [id number]  Chief Complaint  Patient presents with   Ear Pain    Right Started Saturday    HPI  Ear Pain: Patient presents with right ear pain.  Symptoms include right ear pain and plugged sensation in the right ear. Symptoms began 2 days ago and are unchanged since that time. Patient denies chills, dyspnea, facial pain  , fever, nasal congestion, nonproductive cough, productive cough, sore throat, and tooth pain   .       ROS All review of systems negative except what is listed in the HPI      Objective:    BP 96/79   Pulse 78   Temp 98.1 F (36.7 C)   Resp 16   Ht 5\' 7"  (1.702 m)   Wt 182 lb 9.6 oz (82.8 kg)   SpO2 98%   BMI 28.60 kg/m    Physical Exam Vitals reviewed.  Constitutional:      General: She is not in acute distress.    Appearance: Normal appearance. She is not ill-appearing.  HENT:     Head: Normocephalic and atraumatic.     Right Ear: External ear normal. Swelling and tenderness present. No drainage. There is no impacted cerumen. Tympanic membrane is erythematous and bulging. Tympanic membrane is not perforated.     Left Ear: Tympanic membrane, ear canal and external ear normal.     Nose: Nose normal.     Mouth/Throat:     Mouth: Mucous membranes are moist.     Pharynx: Oropharynx is clear. No oropharyngeal exudate or posterior oropharyngeal erythema.  Musculoskeletal:     Cervical back: Normal range of motion and neck supple. No tenderness.  Lymphadenopathy:     Cervical: No cervical adenopathy.  Neurological:     Mental Status: She is alert.     No results found for any visits on 09/05/22.      Assessment & Plan:   Problem List Items Addressed This Visit   None Visit Diagnoses     Acute otitis media, unspecified otitis media type, right    -  Primary Start amoxicillin and continue supportive measures - ibuprofen, tylenol,  warm compresses, etc.  Patient aware of signs/symptoms requiring further/urgent evaluation.     Relevant Medications   amoxicillin (AMOXIL) 875 MG tablet       Meds ordered this encounter  Medications   amoxicillin (AMOXIL) 875 MG tablet    Sig: Take 1 tablet (875 mg total) by mouth 2 (two) times daily for 7 days.    Dispense:  14 tablet    Refill:  0    Order Specific Question:   Supervising Provider    Answer:   Penni Homans A [[id number]]    Return if symptoms worsen or fail to improve.  Terrilyn Saver, NP

## 2022-11-08 ENCOUNTER — Ambulatory Visit: Payer: Commercial Managed Care - PPO | Admitting: Family Medicine

## 2022-11-08 ENCOUNTER — Encounter: Payer: Self-pay | Admitting: Family Medicine

## 2022-11-08 VITALS — BP 116/78 | HR 78 | Temp 98.0°F | Resp 16 | Ht 67.0 in | Wt 178.2 lb

## 2022-11-08 DIAGNOSIS — J0140 Acute pansinusitis, unspecified: Secondary | ICD-10-CM | POA: Insufficient documentation

## 2022-11-08 MED ORDER — AMOXICILLIN-POT CLAVULANATE 875-125 MG PO TABS: 1.0000 | ORAL_TABLET | Freq: Two times a day (BID) | ORAL | 0 refills | Status: DC

## 2022-11-08 MED ORDER — LEVOCETIRIZINE DIHYDROCHLORIDE 5 MG PO TABS: 5.0000 mg | ORAL_TABLET | Freq: Every evening | ORAL | 5 refills | Status: DC

## 2022-11-08 MED ORDER — AZELASTINE HCL 0.1 % NA SOLN: 1.0000 | Freq: Two times a day (BID) | NASAL | 12 refills | Status: DC

## 2022-11-08 NOTE — Progress Notes (Signed)
Subjective:   By signing my name below, I, Shehryar Baig, attest that this documentation has been prepared under the direction and in the presence of Donato Schultz, DO. 11/08/2022   Patient ID: Christina Coleman, female    DOB: 16-Mar-2001, 21 y.o.   MRN: [id number]  Chief Complaint  Patient presents with   Nasal Congestion    X2 weeks, post nasal drip, stopped up nose Has been using claritin and mucinex     HPI Patient is in today for a office visit.   She complains of congestion for the past 2 weeks. She also developed post nasal drip last week. She developed a cough today. She had mild sore throat when her symptoms started but it has resolved at this time. She denies having any sinus pressure, cough, chest tightness, headaches, sore throat, or fevers at this time. She is taking Claritin and Flonase nasal spray but found no change in her symptoms.    No past medical history on file.  No past surgical history on file.  Family History  Problem Relation Age of Onset   Ulcerative colitis Father    Multiple sclerosis Sister    Diabetes Paternal Grandmother    Diabetes Paternal Grandfather     Social History   Socioeconomic History   Marital status: Single    Spouse name: Not on file   Number of children: Not on file   Years of education: Not on file   Highest education level: Not on file  Occupational History   Not on file  Tobacco Use   Smoking status: Never   Smokeless tobacco: Not on file  Substance and Sexual Activity   Alcohol use: Not Currently   Drug use: Not Currently   Sexual activity: Not Currently  Other Topics Concern   Not on file  Social History Narrative   Not on file   Social Determinants of Health   Financial Resource Strain: Not on file  Food Insecurity: Not on file  Transportation Needs: Not on file  Physical Activity: Not on file  Stress: Not on file  Social Connections: Not on file  Intimate Partner Violence: Not on file    No  outpatient medications prior to visit.   No facility-administered medications prior to visit.    No Known Allergies  Review of Systems  Constitutional:  Negative for fever and malaise/fatigue.  HENT:  Positive for congestion. Negative for sinus pain (sinus pressure) and sore throat.        (+)post nasal drip  Eyes:  Negative for blurred vision.  Respiratory:  Positive for cough. Negative for shortness of breath.        (-)chest tightness  Cardiovascular:  Negative for chest pain, palpitations and leg swelling.  Gastrointestinal:  Negative for abdominal pain, blood in stool and nausea.  Genitourinary:  Negative for dysuria and frequency.  Musculoskeletal:  Negative for falls.  Skin:  Negative for rash.  Neurological:  Negative for dizziness, loss of consciousness and headaches.  Endo/Heme/Allergies:  Negative for environmental allergies.  Psychiatric/Behavioral:  Negative for depression. The patient is not nervous/anxious.        Objective:    Physical Exam Vitals and nursing note reviewed.  Constitutional:      General: She is not in acute distress.    Appearance: Normal appearance. She is not ill-appearing.  HENT:     Head: Normocephalic and atraumatic.     Right Ear: Tympanic membrane, ear canal and external ear normal.  Left Ear: Tympanic membrane, ear canal and external ear normal.  Eyes:     Extraocular Movements: Extraocular movements intact.     Pupils: Pupils are equal, round, and reactive to light.  Cardiovascular:     Rate and Rhythm: Normal rate and regular rhythm.     Heart sounds: Normal heart sounds. No murmur heard.    No gallop.  Pulmonary:     Effort: Pulmonary effort is normal. No respiratory distress.     Breath sounds: Normal breath sounds. No wheezing or rales.  Lymphadenopathy:     Cervical: Cervical adenopathy present.  Skin:    General: Skin is warm and dry.  Neurological:     Mental Status: She is alert and oriented to person, place, and  time.  Psychiatric:        Judgment: Judgment normal.     BP 116/78   Pulse 78   Temp 98 F (36.7 C) (Oral)   Resp 16   Ht  (1.702 m)   Wt 178 lb 4 oz (80.9 kg)   SpO2 99%   BMI 27.92 kg/m  Wt Readings from Last 3 Encounters:  11/08/22 178 lb 4 oz (80.9 kg)  09/05/22 182 lb 9.6 oz (82.8 kg)  08/16/22 182 lb 3.2 oz (82.6 kg)       Assessment & Plan:  Acute non-recurrent pansinusitis Assessment & Plan: Augmentin for 10 days  Con't flonase and add astelin  Stop claritin and start xyzal  Return to office as needed   Orders: -     Amoxicillin-Pot Clavulanate; Take 1 tablet by mouth 2 (two) times daily.  Dispense: 20 tablet; Refill: 0 -     Azelastine HCl; Place 1 spray into both nostrils 2 (two) times daily. Use in each nostril as directed  Dispense: 30 mL; Refill: 12 -     Levocetirizine Dihydrochloride; Take 1 tablet (5 mg total) by mouth every evening.  Dispense: 30 tablet; Refill: 5    I, Donato Schultz, DO, personally preformed the services described in this documentation.  All medical record entries made by the scribe were at my direction and in my presence.  I have reviewed the chart and discharge instructions (if applicable) and agree that the record reflects my personal performance and is accurate and complete. 11/08/2022   I,Shehryar Baig,acting as a scribe for Donato Schultz, DO.,have documented all relevant documentation on the behalf of Donato Schultz, DO,as directed by  Donato Schultz, DO while in the presence of Donato Schultz, DO.   Donato Schultz, DO

## 2022-11-08 NOTE — Assessment & Plan Note (Signed)
Augmentin for 10 days  Con't flonase and add astelin  Stop claritin and start xyzal  Return to office as needed

## 2022-12-21 DIAGNOSIS — Z01411 Encounter for gynecological examination (general) (routine) with abnormal findings: Secondary | ICD-10-CM | POA: Diagnosis not present

## 2022-12-21 DIAGNOSIS — N926 Irregular menstruation, unspecified: Secondary | ICD-10-CM | POA: Diagnosis not present

## 2022-12-22 ENCOUNTER — Encounter: Payer: Self-pay | Admitting: Family Medicine

## 2022-12-26 DIAGNOSIS — N926 Irregular menstruation, unspecified: Secondary | ICD-10-CM | POA: Diagnosis not present

## 2023-01-11 DIAGNOSIS — N970 Female infertility associated with anovulation: Secondary | ICD-10-CM | POA: Diagnosis not present

## 2023-01-11 DIAGNOSIS — N926 Irregular menstruation, unspecified: Secondary | ICD-10-CM | POA: Diagnosis not present

## 2023-01-11 DIAGNOSIS — L700 Acne vulgaris: Secondary | ICD-10-CM | POA: Diagnosis not present

## 2023-01-11 DIAGNOSIS — N838 Other noninflammatory disorders of ovary, fallopian tube and broad ligament: Secondary | ICD-10-CM | POA: Diagnosis not present

## 2023-01-11 DIAGNOSIS — E282 Polycystic ovarian syndrome: Secondary | ICD-10-CM | POA: Diagnosis not present

## 2023-01-11 DIAGNOSIS — Z30011 Encounter for initial prescription of contraceptive pills: Secondary | ICD-10-CM | POA: Diagnosis not present

## 2023-01-12 DIAGNOSIS — H5213 Myopia, bilateral: Secondary | ICD-10-CM | POA: Diagnosis not present

## 2023-01-18 DIAGNOSIS — H33321 Round hole, right eye: Secondary | ICD-10-CM | POA: Diagnosis not present

## 2023-01-18 DIAGNOSIS — H35412 Lattice degeneration of retina, left eye: Secondary | ICD-10-CM | POA: Diagnosis not present

## 2023-01-18 DIAGNOSIS — H43393 Other vitreous opacities, bilateral: Secondary | ICD-10-CM | POA: Diagnosis not present

## 2023-01-25 DIAGNOSIS — H33321 Round hole, right eye: Secondary | ICD-10-CM | POA: Diagnosis not present

## 2023-02-02 DIAGNOSIS — H33322 Round hole, left eye: Secondary | ICD-10-CM | POA: Diagnosis not present

## 2023-02-22 DIAGNOSIS — H43393 Other vitreous opacities, bilateral: Secondary | ICD-10-CM | POA: Diagnosis not present

## 2023-02-22 DIAGNOSIS — H31093 Other chorioretinal scars, bilateral: Secondary | ICD-10-CM | POA: Diagnosis not present

## 2023-02-22 DIAGNOSIS — H35413 Lattice degeneration of retina, bilateral: Secondary | ICD-10-CM | POA: Diagnosis not present

## 2023-03-20 ENCOUNTER — Encounter: Payer: Self-pay | Admitting: Family Medicine

## 2023-03-20 ENCOUNTER — Ambulatory Visit: Payer: Commercial Managed Care - PPO | Admitting: Family Medicine

## 2023-03-20 VITALS — BP 131/78 | HR 72 | Ht 67.0 in | Wt 187.0 lb

## 2023-03-20 DIAGNOSIS — H6690 Otitis media, unspecified, unspecified ear: Secondary | ICD-10-CM | POA: Diagnosis not present

## 2023-03-20 MED ORDER — AMOXICILLIN-POT CLAVULANATE 875-125 MG PO TABS
1.0000 | ORAL_TABLET | Freq: Two times a day (BID) | ORAL | 0 refills | Status: AC
Start: 2023-03-20 — End: 2023-03-27

## 2023-03-20 NOTE — Progress Notes (Signed)
Acute Office Visit  Subjective:     Patient ID: Christina Coleman, female    DOB: 11-Apr-2001, 22 y.o.   MRN: [id number]  Chief Complaint  Patient presents with   Ear Pain    HPI Patient is in today for right ear pain.   Discussed the use of AI scribe software for clinical note transcription with the patient, who gave verbal consent to proceed.  History of Present Illness   The patient presented with right-sided ear pain that began approximately a week prior to the consultation. The pain was severe, particularly when the area around the ear was touched, but it significantly decreased over the weekend. Despite the improvement, the patient still experienced mild pain when pressure was applied to the area. The patient denied any associated symptoms such as redness, rash, swelling, fever, chills, body aches, runny nose, coughing, or sneezing.  The patient reported a history of swimming about a week before the onset of the symptoms, raising the possibility of swimmer's ear. They also reported occasional popping or crackling sounds in the ear when chewing or yawning. The patient had been self-treating the pain with an over-the-counter antibiotic ointment from [facility], which they applied using a Q-tip. This treatment seemed to have helped in reducing the pain.  The patient has a history of similar ear infections, with the last episode occurring in February. They denied having frequent colds or allergies. 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 They are also preparing to apply to medical school.          ROS All review of systems negative except what is listed in the HPI      Objective:    BP 131/78   Pulse 72   Ht 5\' 7"  (1.702 m)   Wt 187 lb (84.8 kg)   SpO2 100%   BMI 29.29 kg/m    Physical Exam Vitals reviewed.  Constitutional:      Appearance: Normal appearance.  HENT:     Head:  Normocephalic and atraumatic.     Right Ear: Tympanic membrane is erythematous and bulging.     Left Ear: Tympanic membrane normal.     Ears:     Comments: Right canal with erythema and mild inflammation Musculoskeletal:     Cervical back: Normal range of motion and neck supple. No tenderness.  Lymphadenopathy:     Cervical: No cervical adenopathy.  Neurological:     Mental Status: She is alert and oriented to person, place, and time.  Psychiatric:        Mood and Affect: Mood normal.        Behavior: Behavior normal.        Thought Content: Thought content normal.        Judgment: Judgment normal.     No results found for any visits on 03/20/23.      Assessment & Plan:   Problem List Items Addressed This Visit   None Visit Diagnoses     Acute otitis media, unspecified otitis media type    -  Primary Start Augmentin.  PRN tylenol/ibuprofen, discussed supportive measures Patient aware of signs/symptoms requiring further/urgent evaluation.     Relevant Medications   amoxicillin-clavulanate (AUGMENTIN) 875-125 MG tablet       Meds ordered this encounter  Medications   amoxicillin-clavulanate (AUGMENTIN) 875-125 MG tablet    Sig: Take 1 tablet by mouth 2 (two) times daily for 7 days.  Dispense:  14 tablet    Refill:  0    Order Specific Question:   Supervising Provider    Answer:   Danise Edge A [[id number]]    Return if symptoms worsen or fail to improve.  Clayborne Dana, NP

## 2023-05-04 DIAGNOSIS — H31093 Other chorioretinal scars, bilateral: Secondary | ICD-10-CM | POA: Diagnosis not present

## 2023-05-04 DIAGNOSIS — H43393 Other vitreous opacities, bilateral: Secondary | ICD-10-CM | POA: Diagnosis not present

## 2023-05-04 DIAGNOSIS — H33321 Round hole, right eye: Secondary | ICD-10-CM | POA: Diagnosis not present

## 2023-05-05 DIAGNOSIS — Z3041 Encounter for surveillance of contraceptive pills: Secondary | ICD-10-CM | POA: Diagnosis not present

## 2023-05-05 DIAGNOSIS — E282 Polycystic ovarian syndrome: Secondary | ICD-10-CM | POA: Diagnosis not present

## 2023-05-05 DIAGNOSIS — N939 Abnormal uterine and vaginal bleeding, unspecified: Secondary | ICD-10-CM | POA: Diagnosis not present

## 2023-05-11 ENCOUNTER — Ambulatory Visit: Payer: Commercial Managed Care - PPO | Admitting: Family Medicine

## 2023-05-11 ENCOUNTER — Encounter: Payer: Self-pay | Admitting: Family Medicine

## 2023-05-11 VITALS — BP 127/85 | HR 94 | Temp 98.3°F | Ht 67.0 in | Wt 187.0 lb

## 2023-05-11 DIAGNOSIS — H60503 Unspecified acute noninfective otitis externa, bilateral: Secondary | ICD-10-CM

## 2023-05-11 DIAGNOSIS — H6593 Unspecified nonsuppurative otitis media, bilateral: Secondary | ICD-10-CM | POA: Diagnosis not present

## 2023-05-11 MED ORDER — FLUTICASONE PROPIONATE 50 MCG/ACT NA SUSP
2.0000 | Freq: Every day | NASAL | 6 refills | Status: AC
Start: 2023-05-11 — End: ?

## 2023-05-11 MED ORDER — CIPROFLOXACIN-DEXAMETHASONE 0.3-0.1 % OT SUSP
4.0000 [drp] | Freq: Two times a day (BID) | OTIC | 0 refills | Status: AC
Start: 2023-05-11 — End: 2023-05-18

## 2023-05-11 NOTE — Progress Notes (Signed)
Acute Office Visit  Subjective:     Patient ID: Christina Coleman, female    DOB: 04/04/2001, 22 y.o.   MRN: [id number]  Chief Complaint  Patient presents with   Ear Pain    HPI Patient is in today for ear pain.   Discussed the use of AI scribe software for clinical note transcription with the patient, who gave verbal consent to proceed.  History of Present Illness   The patient, with a history of recurrent ear infections, presents with discomfort in both ears, more pronounced on the left side. The symptoms started the previous day and have progressively worsened. The patient denies any recent swimming activities but admits to using Q-tips for ear wax removal. They also report a concurrent cough and a sensation of either congestion or postnasal drip, which they have been managing with Fluticasone nasal spray.  The patient has had two ear infections this calendar year, one in February and another in August, the latter of which was particularly painful. They have been using a topical antibiotic ointment from [facility] to manage the current ear discomfort. The patient also uses air pods and cleans them with alcohol wipes. They have not introduced any new substances into their ears.  The patient's discomfort is rated as a 5 out of 10. They deny any systemic symptoms such as trouble breathing, fevers, body aches, or chills. The primary concern is the ear discomfort and drainage.            ROS All review of systems negative except what is listed in the HPI      Objective:    BP 127/85   Pulse 94   Temp 98.3 F (36.8 C) (Oral)   Ht 5\' 7"  (1.702 m)   Wt 187 lb (84.8 kg)   SpO2 98%   BMI 29.29 kg/m    Physical Exam Vitals reviewed.  Constitutional:      Appearance: Normal appearance.  HENT:     Head: Normocephalic and atraumatic.     Ears:     Comments: Bilateral TMs with mild effusion, landmarks visible, not bulging or erythematous Bilateral canals with inflammation and  erythema     Mouth/Throat:     Mouth: Mucous membranes are moist.     Pharynx: Oropharynx is clear. No oropharyngeal exudate or posterior oropharyngeal erythema.  Musculoskeletal:     Cervical back: Normal range of motion and neck supple. No tenderness.  Lymphadenopathy:     Cervical: No cervical adenopathy.  Neurological:     Mental Status: She is alert and oriented to person, place, and time.  Psychiatric:        Mood and Affect: Mood normal.        Behavior: Behavior normal.        Thought Content: Thought content normal.        Judgment: Judgment normal.     No results found for any visits on 05/11/23.      Assessment & Plan:   Problem List Items Addressed This Visit   None Visit Diagnoses     Acute otitis externa of both ears, unspecified type    -  Primary   Relevant Medications   ciprofloxacin-dexamethasone (CIPRODEX) OTIC suspension   fluticasone (FLONASE) 50 MCG/ACT nasal spray   Other Relevant Orders   Ambulatory referral to ENT   Fluid level behind tympanic membrane of both ears       Relevant Orders   Ambulatory referral to ENT  Third episode this year (prior episodes were AOM), with current symptoms of bilateral ear discomfort and drainage. No systemic symptoms. Examination reveals red and swollen ear canals, with some fluid behind the eardrums. -Start antibiotic ear drops  -Stop using Q-tips. -Add Flonase nasal spray daily -Contact our office if inner ear pain worsens over the weekend and we can try another oral antibiotic (most recently had Augment in August for AOM) -Referral to ENT for further evaluation due to recurrent infections.          Meds ordered this encounter  Medications   ciprofloxacin-dexamethasone (CIPRODEX) OTIC suspension    Sig: Place 4 drops into both ears 2 (two) times daily for 7 days.    Dispense:  2.8 mL    Refill:  0    Order Specific Question:   Supervising Provider    Answer:   Danise Edge A [[id number]]    fluticasone (FLONASE) 50 MCG/ACT nasal spray    Sig: Place 2 sprays into both nostrils daily.    Dispense:  16 g    Refill:  6    Order Specific Question:   Supervising Provider    Answer:   Danise Edge A [[id number]]    Return if symptoms worsen or fail to improve.  Clayborne Dana, NP

## 2023-05-17 ENCOUNTER — Encounter (INDEPENDENT_AMBULATORY_CARE_PROVIDER_SITE_OTHER): Payer: Self-pay | Admitting: Otolaryngology

## 2023-05-17 DIAGNOSIS — M25571 Pain in right ankle and joints of right foot: Secondary | ICD-10-CM | POA: Diagnosis not present

## 2023-05-24 ENCOUNTER — Encounter (INDEPENDENT_AMBULATORY_CARE_PROVIDER_SITE_OTHER): Payer: Self-pay

## 2023-05-24 ENCOUNTER — Ambulatory Visit (INDEPENDENT_AMBULATORY_CARE_PROVIDER_SITE_OTHER): Payer: Commercial Managed Care - PPO | Admitting: Otolaryngology

## 2023-05-24 VITALS — Ht 67.0 in | Wt 185.0 lb

## 2023-05-24 DIAGNOSIS — R0981 Nasal congestion: Secondary | ICD-10-CM

## 2023-05-24 DIAGNOSIS — H6983 Other specified disorders of Eustachian tube, bilateral: Secondary | ICD-10-CM

## 2023-05-24 DIAGNOSIS — J310 Chronic rhinitis: Secondary | ICD-10-CM | POA: Diagnosis not present

## 2023-05-25 ENCOUNTER — Institutional Professional Consult (permissible substitution) (INDEPENDENT_AMBULATORY_CARE_PROVIDER_SITE_OTHER): Payer: Commercial Managed Care - PPO

## 2023-05-27 DIAGNOSIS — H6983 Other specified disorders of Eustachian tube, bilateral: Secondary | ICD-10-CM | POA: Insufficient documentation

## 2023-05-27 DIAGNOSIS — J310 Chronic rhinitis: Secondary | ICD-10-CM | POA: Insufficient documentation

## 2023-05-27 NOTE — Progress Notes (Signed)
Patient ID: Christina Coleman, female   DOB: 03-Jan-2001, 22 y.o.   MRN: [id number]  CC: Recurrent ear infections  HPI:  Christina Coleman is a 22 y.o. female who presents today complaining of recurrent ear infections.  She has had 3 episodes of otitis media this year.  Her last episode was 2 weeks ago.  At that time, she noted bilateral ear pain and muffled hearing.  She was treated with antibiotic eardrops.  Currently she denies any otalgia or otorrhea.  However, she has a clogging sensation in her ears.  She has no previous ENT surgery.  She was started on Flonase nasal spray 1 month ago.  History reviewed. No pertinent past medical history.  History reviewed. No pertinent surgical history.  Family History  Problem Relation Age of Onset   Ulcerative colitis Father    Multiple sclerosis Sister    Diabetes Paternal Grandmother    Diabetes Paternal Grandfather     Social History:  reports that she has never smoked. She does not have any smokeless tobacco history on file. She reports that she does not currently use alcohol. She reports that she does not currently use drugs.  Allergies: No Known Allergies  Prior to Admission medications   Medication Sig Start Date End Date Taking? Authorizing Provider  fluticasone (FLONASE) 50 MCG/ACT nasal spray Place 2 sprays into both nostrils daily. 05/11/23  Yes Clayborne Dana, NP  levonorgestrel-ethinyl estradiol (ALESSE) 0.1-20 MG-MCG tablet Take 1 tablet by mouth daily. 05/05/23  Yes [provider]   Height 5\' 7"  (1.702 m), weight 185 lb (83.9 kg). Exam: General: Communicates without difficulty, well nourished, no acute distress. Head: Normocephalic, no evidence injury, no tenderness, facial buttresses intact without stepoff. Face/sinus: No tenderness to palpation and percussion. Facial movement is normal and symmetric. Eyes: PERRL, EOMI. No scleral icterus, conjunctivae clear. Neuro: CN II exam reveals vision grossly intact.  No nystagmus at any point  of gaze. Ears: Auricles well formed without lesions.  Ear canals are intact without mass or lesion.  No erythema or edema is appreciated.  The TMs are intact without fluid. Nose: External evaluation reveals normal support and skin without lesions.  Dorsum is intact.  Anterior rhinoscopy reveals congested mucosa over anterior aspect of inferior turbinates and intact septum.  No purulence noted. Oral:  Oral cavity and oropharynx are intact, symmetric, without erythema or edema.  Mucosa is moist without lesions. Neck: Full range of motion without pain.  There is no significant lymphadenopathy.  No masses palpable.  Thyroid bed within normal limits to palpation.  Parotid glands and submandibular glands equal bilaterally without mass.  Trachea is midline. Neuro:  CN 2-12 grossly intact.    Assessment: 1.  History of recurrent ear infections.  However, no acute infection is noted today.  Her ear canals, tympanic membranes, and middle ear spaces are normal. 2.  Clinical eustachian tube dysfunction. 3.  Chronic rhinitis with nasal mucosal congestion.  Plan: 1.  The physical exam findings are reviewed with the patient. 2.  The patient is reassured that no acute infection is noted today. 3.  Continue Flonase nasal spray daily. 4.  Valsalva exercise multiple times a day. 5.  The patient is instructed to call my office if she experiences any acute ear infection.  I would like to evaluate her ears while she is symptomatic.  Adilson Grafton W Benedicta Sultan 05/27/2023, 9:48 AM

## 2023-06-21 ENCOUNTER — Encounter: Payer: Self-pay | Admitting: Family Medicine

## 2023-06-21 ENCOUNTER — Ambulatory Visit: Payer: Commercial Managed Care - PPO | Admitting: Family Medicine

## 2023-06-21 VITALS — BP 135/80 | HR 70 | Ht 67.0 in | Wt 184.0 lb

## 2023-06-21 DIAGNOSIS — L700 Acne vulgaris: Secondary | ICD-10-CM

## 2023-06-21 MED ORDER — CLINDAMYCIN PHOS-BENZOYL PEROX 1-5 % EX GEL: Freq: Two times a day (BID) | CUTANEOUS | 0 refills | Status: AC

## 2023-06-21 NOTE — Patient Instructions (Addendum)
  ACNE Recommendations  Products: Face Wash:  Use a gentle cleanser with, such as Cetaphil (generic version of this is fine) -Salicylic acid is a key ingredient in washes/creams for acne management Moisturizer:  Use an "oil-free" moisturizer with SPF Prescription Cream(s):  Benzaclin in the morning and at bedtime. Can use Adapalene at night as well (may want to start just a few nights per week until your skin gets used to it as it can cause drying)    Remember: Your acne will probably get worse before it gets better It takes at least 2 months for the medicines to start working Use oil free soaps and lotions; these can be over the counter or store-brand Don't use harsh scrubs or astringents, these can make skin irritation and acne worse Moisturize daily with oil free lotion because the acne medicines will dry your skin  Call your doctor if you have: Lots of skin dryness or redness that doesn't get better if you use a moisturizer or if you use the prescription cream or lotion every other day    Stop using the acne medicine immediately and see your doctor if you are or become pregnant or if you think you had an allergic reaction (itchy rash, difficulty breathing, nausea, vomiting) to your acne medication.  It will take 4 - 6 weeks before you see improvement, so don't get discouraged! Not every medicine works the same for everyone.  We will continue to pick and choose what works best for YOU. Avoid topical oils on your face such as cocoa butter, grease, other oils. Don't squeeze pimples. Exercise regularly and try to reduce your stress level. Use a mild soap on your face, such as Dove or [place].  Adapalene:   Use once daily in the evening after you have washed your face with soap and water.   It may make your face break out more in the 1st month.  It will then improve.

## 2023-06-21 NOTE — Progress Notes (Signed)
   Acute Office Visit  Subjective:     Patient ID: Christina Coleman, female    DOB: 2000-11-03, 22 y.o.   MRN: [id number]  Chief Complaint  Patient presents with   Skin Problem    HPI Patient is in today for acne.   Discussed the use of AI scribe software for clinical note transcription with the patient, who gave verbal consent to proceed.  History of Present Illness   The patient, diagnosed with Polycystic Ovary Syndrome (PCOS) and on birth control, presents with a recent increase in acne breakouts. The breakouts are described as a mix of red and whitehead lesions, primarily located on the cheeks and chin. The patient reports these breakouts to be painful and stinging. They deny any similar issues on the body, back, or chest.  The patient also reports hyperpigmentation under the chin. They have been using a regular bar soap for daily facial cleansing and over-the-counter spot treatments, including adapalene gel and benzoyl peroxide creams, without significant improvement.  The patient has been on birth control for PCOS management, which initially helped clear the skin and regulate the menstrual cycle. However, they have not noticed any other beneficial effects from the birth control. The patient denies any major diet changes and suggests stress could be a contributing factor to the current skin condition.           ROS All review of systems negative except what is listed in the HPI      Objective:    BP 135/80   Pulse 70   Ht 5\' 7"  (1.702 m)   Wt 184 lb (83.5 kg)   SpO2 100%   BMI 28.82 kg/m    Physical Exam Vitals reviewed.  Constitutional:      Appearance: Normal appearance.  Skin:    Findings: Acne present.  Neurological:     Mental Status: She is alert and oriented to person, place, and time.           No results found for any visits on 06/21/23.      Assessment & Plan:   Problem List Items Addressed This Visit   None Visit Diagnoses     Acne  vulgaris    -  Primary   Relevant Medications   clindamycin-benzoyl peroxide (BENZACLIN) gel   Other Relevant Orders   Ambulatory referral to Dermatology     Discussed general measures: cleanser with salicylic acid, Benzaclin gel BID, adapalene (work up to nightly use as tolerated) Referral to dermatology     Meds ordered this encounter  Medications   clindamycin-benzoyl peroxide (BENZACLIN) gel    Sig: Apply topically 2 (two) times daily.    Dispense:  25 g    Refill:  0    Order Specific Question:   Supervising Provider    Answer:   Danise Edge A [[id number]]    Return if symptoms worsen or fail to improve.  Clayborne Dana, NP

## 2023-07-10 DIAGNOSIS — H33321 Round hole, right eye: Secondary | ICD-10-CM | POA: Diagnosis not present

## 2023-08-24 ENCOUNTER — Telehealth (INDEPENDENT_AMBULATORY_CARE_PROVIDER_SITE_OTHER): Payer: Self-pay | Admitting: Otolaryngology

## 2023-08-24 NOTE — Telephone Encounter (Signed)
LVM to confirm appt & location [id number] afm

## 2023-08-25 ENCOUNTER — Ambulatory Visit (INDEPENDENT_AMBULATORY_CARE_PROVIDER_SITE_OTHER): Payer: Commercial Managed Care - PPO

## 2023-08-25 ENCOUNTER — Ambulatory Visit: Payer: Commercial Managed Care - PPO | Admitting: Family Medicine

## 2023-11-23 ENCOUNTER — Ambulatory Visit: Payer: Commercial Managed Care - PPO | Admitting: Dermatology

## 2023-11-23 ENCOUNTER — Encounter: Payer: Self-pay | Admitting: Dermatology

## 2023-11-23 VITALS — BP 124/81 | HR 90

## 2023-11-23 DIAGNOSIS — L819 Disorder of pigmentation, unspecified: Secondary | ICD-10-CM | POA: Diagnosis not present

## 2023-11-23 DIAGNOSIS — L700 Acne vulgaris: Secondary | ICD-10-CM | POA: Diagnosis not present

## 2023-11-23 DIAGNOSIS — L905 Scar conditions and fibrosis of skin: Secondary | ICD-10-CM

## 2023-11-23 DIAGNOSIS — L810 Postinflammatory hyperpigmentation: Secondary | ICD-10-CM

## 2023-11-23 MED ORDER — SPIRONOLACTONE 100 MG PO TABS: 100.0000 mg | ORAL_TABLET | Freq: Every day | ORAL | 6 refills | Status: AC

## 2023-11-23 MED ORDER — TRETINOIN 0.05 % EX CREA: TOPICAL_CREAM | Freq: Every day | CUTANEOUS | 3 refills | Status: AC

## 2023-11-23 NOTE — Patient Instructions (Addendum)
 Hello Christina Coleman,  Thank you for visiting today. Here is a summary of the key instructions:  - Medications:   - Start spironolactone  100 mg tablet daily with dinner   - Continue using clindamycin  gel in the morning   - Start tretinoin  0.05% cream at night, 2 nights a week for the first month, then 3 nights a week   - Continue current birth control Neurosurgeon)  - Skincare Routine:   Morning:   - Wash face with Cetaphil cleanser   - Apply clindamycin  gel   - Use Eucerin Radiant Tone serum on dark areas (under eyes and chin)   - Moisturize with Cetaphil moisturizer   - Apply Neutrogena mineral sunscreen    Night:   - Wash face with Cetaphil cleanser   - Apply a small amount of Cetaphil moisturizer   - Apply a pea-sized amount of tretinoin  (avoid eyes and lips)   - Apply another layer of Cetaphil moisturizer  - General Instructions:   - Drink 8 glasses (6-8 ounces each) of water daily   - Avoid using salicylic acid products with tretinoin    - Use Eucerin Radiant Tone serum morning and night on dark areas  - Follow-up: Return for follow-up appointment in 4 months  Please reach out if you have any questions or concerns.  Warm regards,  Dr. Louana Roup, Dermatology       Important Information   Due to recent changes in healthcare laws, you may see results of your pathology and/or laboratory studies on MyChart before the doctors have had a chance to review them. We understand that in some cases there may be results that are confusing or concerning to you. Please understand that not all results are received at the same time and often the doctors may need to interpret multiple results in order to provide you with the best plan of care or course of treatment. Therefore, we ask that you please give us  2 business days to thoroughly review all your results before contacting the office for clarification. Should we see a critical lab result, you will be contacted sooner.     If You Need  Anything After Your Visit   If you have any questions or concerns for your doctor, please call our main line at [phone]. If no one answers, please leave a voicemail as directed and we will return your call as soon as possible. Messages left after 4 pm will be answered the following business day.    You may also send us  a message via MyChart. We typically respond to MyChart messages within 1-2 business days.  For prescription refills, please ask your pharmacy to contact our office. Our fax number is [phone].  If you have an urgent issue when the clinic is closed that cannot wait until the next business day, you can page your doctor at the number below.     Please note that while we do our best to be available for urgent issues outside of office hours, we are not available 24/7.    If you have an urgent issue and are unable to reach us , you may choose to seek medical care at your doctor's office, retail clinic, urgent care center, or emergency room.   If you have a medical emergency, please immediately call 911 or go to the emergency department. In the event of inclement weather, please call our main line at [phone] for an update on the status of any delays or closures.  Dermatology Medication Tips: Please  keep the boxes that topical medications come in in order to help keep track of the instructions about where and how to use these. Pharmacies typically print the medication instructions only on the boxes and not directly on the medication tubes.   If your medication is too expensive, please contact our office at [phone] or send us  a message through MyChart.    We are unable to tell what your co-pay for medications will be in advance as this is different depending on your insurance coverage. However, we may be able to find a substitute medication at lower cost or fill out paperwork to get insurance to cover a needed medication.    If a prior authorization is required to get  your medication covered by your insurance company, please allow us  1-2 business days to complete this process.   Drug prices often vary depending on where the prescription is filled and some pharmacies may offer cheaper prices.   The website www.goodrx.com contains coupons for medications through different pharmacies. The prices here do not account for what the cost may be with help from insurance (it may be cheaper with your insurance), but the website can give you the price if you did not use any insurance.  - You can print the associated coupon and take it with your prescription to the pharmacy.  - You may also stop by our office during regular business hours and pick up a GoodRx coupon card.  - If you need your prescription sent electronically to a different pharmacy, notify our office through [facility] or by phone at [phone]

## 2023-11-23 NOTE — Progress Notes (Signed)
 New Patient Visit   Subjective  Christina Coleman is a 23 y.o. female who presents for the following: Acne with secondary hyperpigmentation  Patient states she has acne located at the face that she would like to have examined. Patient reports the areas have been there for 2 years. She reports the areas are not bothersome.She states that the areas have not spread. Patient reports she has previously been treated for these areas. Her Pcp prescribe clindamycin  gel which has worked well,She has also tried [employer], and [employer] and Dove bar soaps. Currently she washes with Cetaphil wash. Patient reports she has a dx of PCOS and starting birthcontrol has helped.   The following portions of the chart were reviewed this encounter and updated as appropriate: medications, allergies, medical history  Review of Systems:  No other skin or systemic complaints except as noted in HPI or Assessment and Plan.  Objective  Well appearing patient in no apparent distress; mood and affect are within normal limits.  A focused examination was performed of the following areas: Face  Relevant exam findings are noted in the Assessment and Plan.            Assessment & Plan   ACNE VULGARIS, Acne Scarring and Hyperpigmentation Exam: Open comedones and inflammatory papules  Flared  - Assessment: 23 year old female with PCOS presents with recurrent acne flares, particularly severe in November. Patient is on Vienva birth control for cycle regulation. Acne predominantly affects the lower third of the face, consistent with hormonal acne related to PCOS. Current treatment includes clindamycin  gel, used only during breakouts. Patient also has concerns about hyperpigmentation and scarring from previous acne lesions.  Treatment Plan: - We will plan to prescribe Spironolactone  100 mg to take nightly to help prevent hormonal acne flares   - Informed patient of potential side effects including lightheadedness,  dizziness, and leg cramps if not adequately hydrated - Recommended continuing Clindamycin  Gel then apply Eucerin Radiant Tone after washing with Cetaphil facial wash - Recommended apply daily SPF to prevent scarring form darkening - We will prescribe Tretinoin  0.05%, for the first 1 month apply 2 nights weekly, after first month increase to 3 nights weekly (M,W,F) - Pt to follow up in 4 Months to reassess  For under-eye and chin hyperpigmentation:     - Recommend Eucerin Radiant Tone serum, apply morning and night    Advised to avoid salicylic acid products while using tretinoin     Continue current birth control (Vienva) for PCOS management  Spironolactone  can cause increased urination and cause blood pressure to decrease. Please watch for signs of lightheadedness and be cautious when changing position. It can sometimes cause breast tenderness or an irregular period in premenopausal women. It can also increase potassium. The increase in potassium usually is not a concern unless you are taking other medicines that also increase potassium, so please be sure your doctor knows all of the other medications you are taking. This medication should not be taken by pregnant women.  This medicine should also not be taken together with sulfa drugs like Bactrim (trimethoprim/sulfamethexazole).     ACNE VULGARIS   Related Medications clindamycin -benzoyl peroxide (BENZACLIN) gel Apply topically 2 (two) times daily.  Return in about 4 months (around 03/25/2024) for Acne F/U.  I, Jetta Ager, am acting as Neurosurgeon for [employer], DO.  Documentation: I have reviewed the above documentation for accuracy and completeness, and I agree with the above.  Louana Roup, DO

## 2024-01-15 DIAGNOSIS — H5213 Myopia, bilateral: Secondary | ICD-10-CM | POA: Diagnosis not present

## 2024-01-25 DIAGNOSIS — L700 Acne vulgaris: Secondary | ICD-10-CM | POA: Diagnosis not present

## 2024-01-25 DIAGNOSIS — E282 Polycystic ovarian syndrome: Secondary | ICD-10-CM | POA: Diagnosis not present

## 2024-01-25 DIAGNOSIS — Z01419 Encounter for gynecological examination (general) (routine) without abnormal findings: Secondary | ICD-10-CM | POA: Diagnosis not present

## 2024-01-30 ENCOUNTER — Ambulatory Visit: Payer: Self-pay | Admitting: Family Medicine

## 2024-01-30 ENCOUNTER — Ambulatory Visit: Admitting: Family Medicine

## 2024-01-30 ENCOUNTER — Encounter: Payer: Self-pay | Admitting: Family Medicine

## 2024-01-30 VITALS — BP 124/76 | HR 75 | Ht 67.0 in | Wt 184.0 lb

## 2024-01-30 DIAGNOSIS — R7989 Other specified abnormal findings of blood chemistry: Secondary | ICD-10-CM | POA: Diagnosis not present

## 2024-01-30 DIAGNOSIS — L918 Other hypertrophic disorders of the skin: Secondary | ICD-10-CM | POA: Diagnosis not present

## 2024-01-30 LAB — BASIC METABOLIC PANEL WITH GFR
BUN: 14 mg/dL (ref 6–23)
CO2: 26 meq/L (ref 19–32)
Calcium: 9.6 mg/dL (ref 8.4–10.5)
Chloride: 104 meq/L (ref 96–112)
Creatinine, Ser: 0.71 mg/dL (ref 0.40–1.20)
GFR: 120.88 mL/min (ref >60.00)
Glucose, Bld: 128 mg/dL — ABNORMAL HIGH (ref 70–99)
Potassium: 3.9 meq/L (ref 3.5–5.1)
Sodium: 137 meq/L (ref 135–145)

## 2024-01-30 MED ORDER — MUPIROCIN 2 % EX OINT: 1.0000 | TOPICAL_OINTMENT | Freq: Two times a day (BID) | CUTANEOUS | 1 refills | Status: AC

## 2024-01-30 NOTE — Progress Notes (Signed)
 Acute Office Visit  Subjective:     Patient ID: Christina Coleman, female    DOB: Sep 07, 2000, 23 y.o.   MRN: [id number]  Chief Complaint  Patient presents with   Medical Management of Chronic Issues    HPI Patient is in today for   Discussed the use of AI scribe software for clinical note transcription with the patient, who gave verbal consent to proceed.  History of Present Illness Christina Coleman is a 23 year old female who presents with elevated creatinine levels and a painful skin bump.  Five days ago, her creatinine level was found to be elevated at 1.27 during a visit to her OBGYN. She attributes this to starting spironolactone  in May and has since discontinued the medication. She is following up to recheck her kidney function and has been drinking water in preparation for a blood test.  She reports a painful bump that has gradually grown and became inflamed, irritated. It is similar to a skin tag, initially very painful and inflamed, but has since softened and become less painful. She has stopped shaving and using deodorant in the area to avoid irritation. The bump was particularly painful yesterday but has since settled down. She is concerned about its nature and management.  She will be leaving town in a few weeks to return to school. No current signs of infection in the bump.      ROS All review of systems negative except what is listed in the HPI      Objective:    BP 124/76   Pulse 75   Ht 5' 7 (1.702 m)   Wt 184 lb (83.5 kg)   SpO2 100%   BMI 28.82 kg/m    Physical Exam Vitals reviewed.  Constitutional:      Appearance: Normal appearance.  Skin:    Comments: Irritated skin tag under left upper arm. See picture   Neurological:     Mental Status: She is alert and oriented to person, place, and time.  Psychiatric:        Mood and Affect: Mood normal.        Behavior: Behavior normal.        Thought Content: Thought content normal.        Judgment: Judgment  normal.           No results found for any visits on 01/30/24.      Assessment & Plan:   Problem List Items Addressed This Visit   None Visit Diagnoses       Elevated serum creatinine    -  Primary   Relevant Orders   Basic metabolic panel with GFR     Skin tag       Relevant Medications   mupirocin  ointment (BACTROBAN ) 2 %       Assessment & Plan Skin tag Enlarged, painful skin tag likely due to friction/irritation. Discussed removal procedure, including local anesthesia, minor bleeding, small scar, recurrence risk, and infection risk. - Prescribe mupirocin  ointment twice daily for potential infection. - Advise warm compresses multiple times daily to reduce swelling. - Schedule procedure visit in one week for removal, ensuring area is not inflamed. - Advise avoiding deodorant and using a Band-Aid to prevent friction. - Instruct to monitor for infection signs and contact office if worsens.  Elevated creatinine Elevated creatinine possibly related to spironolactone , now discontinued. Recheck kidney function needed.       Meds ordered this encounter  Medications   mupirocin  ointment (BACTROBAN ) 2 %  Sig: Apply 1 Application topically 2 (two) times daily.    Dispense:  22 g    Refill:  1    Supervising Provider:   DOMENICA BLACKBIRD A [[id number]]    Return for skin tag removal at your convenience .  Waddell KATHEE Mon, NP

## 2024-01-31 ENCOUNTER — Encounter: Payer: Self-pay | Admitting: Family Medicine

## 2024-02-01 NOTE — Telephone Encounter (Signed)
 I want to be positive no signs of infection/irritation first so if she can finish the 5 days of antibiotic ointment that would be preferred. Looks like I have some openings on Wednesday

## 2024-02-07 ENCOUNTER — Ambulatory Visit: Admitting: Family Medicine

## 2024-02-07 ENCOUNTER — Encounter: Payer: Self-pay | Admitting: Family Medicine

## 2024-02-07 VITALS — BP 130/84 | HR 74 | Ht 67.0 in | Wt 184.0 lb

## 2024-02-07 DIAGNOSIS — Z23 Encounter for immunization: Secondary | ICD-10-CM

## 2024-02-07 DIAGNOSIS — L918 Other hypertrophic disorders of the skin: Secondary | ICD-10-CM | POA: Diagnosis not present

## 2024-02-07 NOTE — Progress Notes (Signed)
   Acute Office Visit  Subjective:     Patient ID: Christina Coleman, female    DOB: 12-Feb-2001, 23 y.o.   MRN: [id number]  Chief Complaint  Patient presents with   Skin Tag    HPI Patient is in today for skin tags.   At last visit, patient noted some irritated skin tags. We did a short course of mupirocin  to ensure no local infection. States area is looking better, but still irritated by clothing etc due to location. She would like to have it removed today. No signs of infection.     ROS All review of systems negative except what is listed in the HPI      Objective:    BP 130/84   Pulse 74   Ht 5' 7 (1.702 m)   Wt 184 lb (83.5 kg)   SpO2 100%   BMI 28.82 kg/m    Physical Exam Vitals reviewed.  Constitutional:      Appearance: Normal appearance.  Skin:    Comments: Small skin tags to bilateral upper/inner arms  Neurological:     Mental Status: She is alert and oriented to person, place, and time.  Psychiatric:        Mood and Affect: Mood normal.        Behavior: Behavior normal.        Thought Content: Thought content normal.        Judgment: Judgment normal.     No results found for any visits on 02/07/24.      Assessment & Plan:   Problem List Items Addressed This Visit   None Visit Diagnoses       Skin tag    -  Primary     Need for Tdap vaccination       Relevant Orders   Tdap vaccine greater than or equal to 7yo IM (Completed)        Procedure: Skin tag removal Informed consent:  Discussed risks (permanent scarring, infection, pain, bleeding, bruising, redness, and recurrence of the lesion) and benefits of the procedure, as well as the alternatives.  She is aware that skin tags are benign lesions, and their removal is often not considered medically necessary.  Verbal informed consent was obtained. Anesthesia: topical spray for smaller lesions; lido/epi to larger lesion for shave removal  The area was prepared and draped in a standard  fashion. Snip removal was performed x5. Dermablade/shave removal x1. Antibiotic ointment and a sterile dressing were applied.   The patient tolerated procedure well. The patient was instructed on post-op care.   Number of lesions removed:  6 (left upper/inner arm: 3 small skin tags with snip removal, 1 slightly larger skin tag with shave removal; right upper/inner arm: 2 small skin tags with snip removal)    No orders of the defined types were placed in this encounter.   Return if symptoms worsen or fail to improve.  Waddell KATHEE Mon, NP

## 2024-02-07 NOTE — Patient Instructions (Signed)
Patient Handout: Wound Care for Skin Biopsy Site  Taking Care of Your Skin Biopsy Site  Proper care of the biopsy site is essential for promoting healing and minimizing scarring. This handout provides instructions on how to care for your biopsy site to ensure optimal recovery.  1. Cleaning the Wound:  Clean the biopsy site daily with gentle soap and water. Gently pat the area dry with a clean, soft towel. Avoid harsh scrubbing or rubbing the area, as this can irritate the skin and delay healing. 2. Applying Aquaphor and Bandage:  After cleaning the wound, apply a thin layer of Aquaphor ointment to the biopsy site. Cover the area with a sterile bandage to protect it from dirt, bacteria, and friction. Change the bandage daily or as needed if it becomes soiled or wet. 3. Continued Care for One Week:  Repeat the cleaning, Aquaphor application, and bandaging process daily for one week following the biopsy procedure. Keeping the wound clean and moist during this initial healing period will help prevent infection and promote optimal healing. 4. Massaging Aquaphor into the Area:  After one week, discontinue the use of bandages but continue to apply Aquaphor to the biopsy site. Gently massage the Aquaphor into the area using circular motions. Massaging the skin helps to promote circulation and prevent the formation of scar tissue. Additional Tips:  Avoid exposing the biopsy site to direct sunlight during the healing process, as this can cause hyperpigmentation or worsen scarring. If you experience any signs of infection, such as increased redness, swelling, warmth, or drainage from the wound, contact your healthcare provider immediately. Follow any additional instructions provided by your healthcare provider for caring for the biopsy site and managing any discomfort. Conclusion:  Taking proper care of your skin biopsy site is crucial for ensuring optimal healing and minimizing scarring. By  following these instructions for cleaning, applying Aquaphor, and massaging the area, you can promote a smooth and successful recovery. If you have any questions or concerns about caring for your biopsy site, don't hesitate to contact your healthcare provider for guidance.  

## 2024-02-08 ENCOUNTER — Ambulatory Visit: Admitting: Family Medicine

## 2024-02-09 ENCOUNTER — Ambulatory Visit: Admitting: Family Medicine

## 2024-03-05 DIAGNOSIS — Z1389 Encounter for screening for other disorder: Secondary | ICD-10-CM | POA: Diagnosis not present

## 2024-03-26 DIAGNOSIS — Z1389 Encounter for screening for other disorder: Secondary | ICD-10-CM | POA: Diagnosis not present

## 2024-03-31 ENCOUNTER — Encounter: Payer: Self-pay | Admitting: Dermatology

## 2024-04-01 ENCOUNTER — Encounter: Payer: Self-pay | Admitting: Family Medicine

## 2024-04-02 ENCOUNTER — Telehealth (INDEPENDENT_AMBULATORY_CARE_PROVIDER_SITE_OTHER): Admitting: Family Medicine

## 2024-04-02 VITALS — Ht 67.0 in | Wt 184.0 lb

## 2024-04-02 DIAGNOSIS — L0291 Cutaneous abscess, unspecified: Secondary | ICD-10-CM

## 2024-04-02 MED ORDER — DOXYCYCLINE HYCLATE 100 MG PO TABS: 100.0000 mg | ORAL_TABLET | Freq: Two times a day (BID) | ORAL | 0 refills | Status: AC

## 2024-04-02 NOTE — Progress Notes (Signed)
 Virtual Video Visit via MyChart Note  I connected with  Christina Coleman on 04/02/24 at 12:20 PM EDT by the video enabled telemedicine application for MyChart, and verified that I am speaking with the correct person using two identifiers.   I introduced myself as a Publishing rights manager with the practice. We discussed the limitations of evaluation and management by telemedicine and the availability of in person appointments. The patient expressed understanding and agreed to proceed.  Participating parties in this visit include: The patient and the nurse practitioner listed.  The patient is: At home I am: In the office - [facility]  Subjective:    CC:  Chief Complaint  Patient presents with   Skin Problem    HPI: Christina Coleman is a 23 y.o. year old female presenting today via MyChart today for abscess.   Discussed the use of AI scribe software for clinical note transcription with the patient, who gave verbal consent to proceed.  History of Present Illness Christina Coleman is a 23 year old female with a history of skin and soft tissue infections who presents with a bleeding bump in her groin.  She developed a bump a couple of months ago during the summer. Previously, similar bumps have resolved with the application of bacitracin ointment, but this one has persisted and recently started bleeding. The bump began bleeding a few days ago and continues to do so. Initially, there was a small amount of white discharge followed by significant bleeding. She has been covering it with gauze pads instead of Band-Aids due to the bleeding through the Band-Aids. The bump is located in a 'weird area' of groin and is larger than previous ones, with the opening becoming wider.  She experiences burning when water hits the bump in the shower, but otherwise, it is not painful when covered. The patient reports that the surrounding skin feels soft and is not hot or red. She does not notice any  significant discoloration, though it may be slightly darker in color. No fever, chills, or body aches.  In the past, similar bumps have not bled or leaked, and they were smaller. She is concerned about the size and the bleeding of this bump, which is larger than previous ones. Her mother believes the skin issues may be related to PCOS and insulin resistance.  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        Past medical history, Surgical history, Family history not pertinant except as noted below, Social history, Allergies, and medications have been entered into the medical record, reviewed, and corrections made.   Review of Systems:  All review of systems negative except what is listed in the HPI   Objective:    General:  Speaking clearly in complete sentences. Absent shortness of breath noted.   Alert and oriented x3.   Normal judgment.  Absent acute distress.   Impression and Recommendations:    Problem List Items Addressed This Visit   None Visit Diagnoses       Abscess    -  Primary   Relevant Medications   doxycycline  (VIBRA -TABS) 100 MG tablet        Assessment & Plan Draining cutaneous abscess without systemic symptoms Abscess likely related to PCOS and insulin resistance, spontaneously draining with no systemic symptoms. Differential includes hidradenitis suppurativa. Risk of scarring due to size. - Prescribed doxycycline  for potential use if abscess worsens. Advised taking with water/food and caution  about sun sensitivity. - Advised warm compresses to aid drainage and healing. - Instructed to use bacitracin ointment on gauze pads, change twice daily, and maintain cleanliness. - Advised using lukewarm water and gentle soap, avoiding scrubbing. - Monitor for infection spread or abscess hardening, start antibiotics if necessary.     Follow-up if symptoms worsen or fail to improve.    I discussed the  assessment and treatment plan with the patient. The patient was provided an opportunity to ask questions and all were answered. The patient agreed with the plan and demonstrated an understanding of the instructions.   The patient was advised to call back or seek an in-person evaluation if the symptoms worsen or if the condition fails to improve as anticipated.   Waddell KATHEE Mon, NP

## 2024-04-02 NOTE — Progress Notes (Signed)
 From mychart message:  Hello Christina Coleman, I have had a history with past skin and soft tissue infections. I had a bump in my groin region which formed months ago and went away after using an ointment for several days. It appeared again a few days ago and had discharge coming out of it along with blood the day before yesterday. 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 Would it be possible for you to send me an antibiotic to get rid of the infection? My local pharmacy is the [facility] on [address] in [place], [place]. I have attached a photo of the bump to this message. I am now covering it with a bandaid. Thank you so much.

## 2024-04-09 DIAGNOSIS — Z1389 Encounter for screening for other disorder: Secondary | ICD-10-CM | POA: Diagnosis not present

## 2024-04-23 DIAGNOSIS — Z1389 Encounter for screening for other disorder: Secondary | ICD-10-CM | POA: Diagnosis not present

## 2024-05-02 ENCOUNTER — Ambulatory Visit: Admitting: Dermatology

## 2024-05-08 DIAGNOSIS — Z1389 Encounter for screening for other disorder: Secondary | ICD-10-CM | POA: Diagnosis not present

## 2024-07-12 ENCOUNTER — Ambulatory Visit: Admitting: Family Medicine
